# Patient Record
Sex: Male | Born: 1970 | Race: White | Hispanic: No | Marital: Single | Smoking: Never smoker
Health system: Southern US, Community
[De-identification: ages and names within clinical notes are randomized; demographics above are authoritative.]

## PROBLEM LIST (undated history)

## (undated) DIAGNOSIS — I2699 Other pulmonary embolism without acute cor pulmonale: Secondary | ICD-10-CM

## (undated) HISTORY — PX: LUMBAR SPINE SURGERY: SHX701

## (undated) HISTORY — PX: ANKLE FRACTURE SURGERY: SHX122

## (undated) HISTORY — DX: Other pulmonary embolism without acute cor pulmonale: I26.99

---

## 2006-09-24 DIAGNOSIS — I2699 Other pulmonary embolism without acute cor pulmonale: Secondary | ICD-10-CM

## 2006-09-24 HISTORY — DX: Other pulmonary embolism without acute cor pulmonale: I26.99

## 2008-01-23 ENCOUNTER — Ambulatory Visit (HOSPITAL_BASED_OUTPATIENT_CLINIC_OR_DEPARTMENT_OTHER): Admission: RE | Admit: 2008-01-23 | Discharge: 2008-01-23 | Payer: Self-pay | Admitting: Orthopedic Surgery

## 2008-02-14 ENCOUNTER — Ambulatory Visit: Payer: Self-pay | Admitting: Critical Care Medicine

## 2008-02-14 ENCOUNTER — Inpatient Hospital Stay (HOSPITAL_COMMUNITY): Admission: EM | Admit: 2008-02-14 | Discharge: 2008-02-17 | Payer: Self-pay | Admitting: Emergency Medicine

## 2008-02-17 ENCOUNTER — Encounter (INDEPENDENT_AMBULATORY_CARE_PROVIDER_SITE_OTHER): Payer: Self-pay | Admitting: Internal Medicine

## 2008-02-17 ENCOUNTER — Ambulatory Visit: Payer: Self-pay | Admitting: Vascular Surgery

## 2008-02-17 ENCOUNTER — Encounter (INDEPENDENT_AMBULATORY_CARE_PROVIDER_SITE_OTHER): Payer: Self-pay | Admitting: Emergency Medicine

## 2008-02-18 ENCOUNTER — Encounter: Payer: Self-pay | Admitting: Critical Care Medicine

## 2008-02-18 ENCOUNTER — Ambulatory Visit: Payer: Self-pay | Admitting: Critical Care Medicine

## 2008-02-18 ENCOUNTER — Ambulatory Visit: Payer: Self-pay | Admitting: Cardiology

## 2008-02-18 ENCOUNTER — Inpatient Hospital Stay (HOSPITAL_COMMUNITY): Admission: AD | Admit: 2008-02-18 | Discharge: 2008-02-21 | Payer: Self-pay | Admitting: Critical Care Medicine

## 2008-02-18 DIAGNOSIS — I2699 Other pulmonary embolism without acute cor pulmonale: Secondary | ICD-10-CM

## 2008-02-18 DIAGNOSIS — I82409 Acute embolism and thrombosis of unspecified deep veins of unspecified lower extremity: Secondary | ICD-10-CM | POA: Insufficient documentation

## 2008-02-23 ENCOUNTER — Ambulatory Visit: Payer: Self-pay | Admitting: Cardiovascular Disease

## 2008-02-27 ENCOUNTER — Ambulatory Visit: Payer: Self-pay | Admitting: Cardiovascular Disease

## 2008-03-04 ENCOUNTER — Encounter: Payer: Self-pay | Admitting: Critical Care Medicine

## 2008-03-08 ENCOUNTER — Telehealth (INDEPENDENT_AMBULATORY_CARE_PROVIDER_SITE_OTHER): Payer: Self-pay | Admitting: *Deleted

## 2008-03-08 ENCOUNTER — Encounter: Payer: Self-pay | Admitting: Critical Care Medicine

## 2008-03-12 ENCOUNTER — Ambulatory Visit: Payer: Self-pay | Admitting: Internal Medicine

## 2008-03-12 ENCOUNTER — Ambulatory Visit: Payer: Self-pay | Admitting: Critical Care Medicine

## 2008-03-24 ENCOUNTER — Ambulatory Visit: Payer: Self-pay | Admitting: Internal Medicine

## 2008-05-20 ENCOUNTER — Ambulatory Visit: Payer: Self-pay | Admitting: Cardiology
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
## 2011-02-06 NOTE — H&P (Signed)
NAME:  Nathaniel Manning, CORP NO.:  [id number]   MEDICAL RECORD NO.:  [id number]          PATIENT TYPE:  INP   LOCATION:  1226                         FACILITY:  [facility]   PHYSICIAN:  Charlcie Cradle. Delford Field, MD, FCCPDATE OF BIRTH:  09[birth year]73   DATE OF ADMISSION:  02/18/2008  DATE OF DISCHARGE:                              HISTORY & PHYSICAL   CHIEF COMPLAINT:  Fever and presyncopal episode, previous history of  pulmonary embolism.   HISTORY OF PRESENT ILLNESS:  A 40 year old male was just discharged on  Feb 17, 2008, after being admitted on Feb 14, 2008, for acute pulmonary  embolism right lower lobe and right lower extremity deep venous  thrombosis.  He had previously had open reduction internal fixation of  the fibula fracture three weeks previous, found to have deep venous  thrombosis of the gastroc vein and the popliteal vein with associated  right lower lobe pulmonary embolism.  He was given initially IV heparin  and switched to subcutaneous Lovenox twice daily with Coumadin bridging.  On discharge, his INR was 1.2, today it was 1.8 in the clinic on 15 mg  daily Coumadin.  He was maintaining the Lovenox as well twice daily.  The patient called this morning and stated he was having some more pain  in the chest and fever to 102 degrees and sweats.  We brought him to the  office, obtained a chest x-ray finding and no acute infiltrate or  process.  He had a presyncopal vagal episode while in the office getting  his INR checked with fingerstick.  On this basis, we readmitted him to  the hospital to evaluate for failure to respond to Lovenox therapy as an  outpatient and further studies.   PAST MEDICAL HISTORY:  Essentially noncontributory.   ALLERGIES:  None.   MEDICATIONS PRIOR TO ADMISSION:  Lovenox of twice daily dosing subcu and  associated Coumadin 15 mg daily.   PAST SURGICAL HISTORY:  Ankle fracture surgery only.  There is no other  significant medical history  noted.   FAMILY HISTORY:  Noncontributory.   SOCIAL HISTORY:  Nonsmoker, no alcohol except for social reasons.   PHYSICAL EXAMINATION:  VITAL SIGNS:  Temperature 98, blood pressure  126/74, pulse 104, respirations 12, saturation 100% on room air.  GENERAL:  This is a well-developed, well-nourished white male,  diaphoretic and pale.  CHEST:  Clear bilaterally without evidence of wheeze or rhonchi.  CARDIAC:  Exam showed a regular rate and rhythm without S3, normal S1  and S2.  ABDOMEN:  Soft, nontender.  EXTREMITIES:  Showed some cyanosis right lower extremity and edema  around the ankle but no new changes compared to previous exam two days  previous.   LABORATORY DATA:  Pending at time of this dictation.   CT scan of the chest is pending.   IMPRESSION:  Rule out recurrent or progression of pulmonary emboli  despite Lovenox therapy.  The patient is not yet anticoagulated on the  basis of INR with Coumadin therapy.   RECOMMENDATIONS:  Recheck CT scan of the chest and switch Lovenox  over  to IV heparin and utilize heparin for the full course and bridge with  Coumadin to continue until INR is therapeutic two days in row.  Check  blood cultures and urinalysis and urine cultures for hospital-acquired  infection.  Hold antibiotics as of yet.  Check lab data.  Check echo.      Charlcie Cradle Delford Field, MD, [facility]  Electronically Signed     PEW/MEDQ  D:  02/18/2008  T:  02/18/2008  Job:  [id number]   cc:   Corinna L. Lendell Caprice, MD   Harvie Junior, M.D.  Fax: [phone]

## 2011-02-06 NOTE — Discharge Summary (Signed)
NAME:  Nathaniel Manning, NETTLE NO.:  [id number]   MEDICAL RECORD NO.:  [id number]          PATIENT TYPE:  INP   LOCATION:  1507                         FACILITY:  [facility]   PHYSICIAN:  Charlcie Cradle. Delford Field, MD, FCCPDATE OF BIRTH:  Apr 16, 1971   DATE OF ADMISSION:  02/18/2008  DATE OF DISCHARGE:  02/21/2008                               DISCHARGE SUMMARY   DISCHARGE DIAGNOSIS:  Right pulmonary pain with history of pulmonary  emboli and resistant to anticoagulation on an outpatient basis.   HISTORY OF PRESENT ILLNESS:  Mr. Nathaniel Manning is a 40 year old male  discharged from home on Feb 17, 2008 after being admitted on Feb 14, 2008 for acute pulmonary embolism on the right lower lobe and right  lower extremity, deep venous thrombosis.  He had previously had open  reduction internal fixation and fibula fracture three weeks previously.  Was found to have deep venous thrombosis of the gastroc vein and  popliteal vein associated with right lower lobe pulmonary emboli.  He  was given initially IV heparin, switched to subcutaneous Lovenox b.i.d.,  and was maintaining on Lovenox twice daily.  He was also started on 15  mg of Coumadin with INR 1.8.  The patient called the office.  Reported  he was having some pain in the chest, fever 102 degrees, and sweats.  He  was brought into the office.  Known to have some syncope.  Therefore he  was admitted to the hospital for further evaluation and treatment.   LABORATORY DATA:  WBC 10.8, hemoglobin 12.4, hematocrit 34, platelets  267.  PT 19.3, INR 1.6.  Subsequent INR level 1.9 and 2.3.  Sodium 141,  potassium 4, chloride 106, CO2 28, glucose 95, BUN 9, creatinine 1.02.  Total protein 6.7.  Albumin 3.4.  AST 33, ALT 48. Albumin 128.  Total  bilirubin 0.8.  Blood cultures showed no growth.   2D echocardiogram revealed EF 60%, normal aortic valve, normal mitral  valve.  CT angio of the chest showed decrease in size of larger more  central right  lower lobe pulmonary emboli since prior study, small  peripheral posterior pulmonary embolus which is unchanged, no new  pulmonary emboli, slight increase in size in right pleural effusion,  continued right lower lobe atelectasis, lower infarction posteriorly.  Chest x-ray showed increased atelectasis, lower infarction of the right  medial lobe.   HOSPITAL COURSE BY DISCHARGE DIAGNOSES:  1. History of right lower lobe pulmonary emboli secondary to right      lower extremity deep venous thrombosis.  He was admitted for      continuation of severe pleuritic pain.  CT of the chest did not      reveal any increase in pulmonary emboli or new pulmonary emboli.      His pain was addressed with Tramadol and ibuprofen.  He was placed      on IV heparin and then switched back to Lovenox and continued on      Coumadin 15 mg.  INR was therapeutic, but at discharge at 2.3.  He  had followup with Coumadin clinic with Dr. Shelby Dubin for further      evaluation and control of his Coumadin.  2. History of right lower extremity ankle deep venous thrombosis      status post ORIF.  No change.  3. Right pleurisy which was treated as noted.   DISCHARGE MEDICATIONS:  1. Coumadin 15 mg May 30 and May 31 taken at 6 p.m.  2. Lovenox 95 mg take tonight at 9 p.m.  3. Advil 400-600 mg p.o. as needed.  4. Vicodin as previously prescribed p.r.n.   He has a followup appointment with Dr. Shelby Dubin within two days, Dr.  Shan Levans in one week.   DISPOSITION:  Status condition on discharge is improved.   DIET:  Regular diet.      Devra Dopp, MSN, ACNP      Charlcie Cradle. Delford Field, MD, [facility]  Electronically Signed    SM/MEDQ  D:  03/16/2008  T:  03/16/2008  Job:  [id number]   cc:   Harvie Junior, M.D.  Fax: [phone]   Charlcie Cradle Delford Field, MD, FCCP  [address]  [place]  [place] [address]   Shelby Dubin, PharmD, BCPS, CPP  [address] [place], [place] [address]
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
## 2011-02-06 NOTE — Consult Note (Signed)
NAME:  Nathaniel Manning, Nathaniel Manning NO.:  [id number]   MEDICAL RECORD NO.:  [id number]          PATIENT TYPE:  INP   LOCATION:  4708                         FACILITY:  [facility]   PHYSICIAN:  Charlcie Cradle. Delford Field, MD, FCCPDATE OF BIRTH:  [birth year]   DATE OF CONSULTATION:  02/14/2008  DATE OF DISCHARGE:                                 CONSULTATION   HISTORY OF PRESENT ILLNESS:  This is a 40 year old male with previous  ankle surgery 2-1/2 weeks ago, presenting with right-sided pleuritic  chest pain and found to have right lower lobe pulmonary embolism on CT  scan.  He was admitted for further inpatient care, now on IV heparin.  CT abdomen was negative.   PAST MEDICAL HISTORY:  Noncontributory.   MEDICATIONS:  Prior to admission, Percocet.   ALLERGIES:  None.   SOCIAL HISTORY:  Denies tobacco, drinks beer occasionally.   FAMILY HISTORY:  Lymphatic cancer in the grandfather.   REVIEW OF SYSTEMS:  Noncontributory.   PHYSICAL EXAMINATION:  VITAL SIGNS:  T-max 101.8, blood pressure  satisfactory, saturation is 95%, and respirations 20.  HEENT:  Unremarkable.  CHEST:  Showed to be clear.  Decreased breath sounds on the right.  CARDIAC:  Showed a regular rate and rhythm.  Normal S1 and S2.  ABDOMEN:  Soft and nontender.  EXTREMITIES:  Showed no edema or clubbing, except there is a tenderness  in the right lower extremity, boot in place.   LABORATORY DATA:  CT scan of the chest shows right lower lobe pulmonary  embolism.  White count 13,000, hemoglobin 13.1, platelet count 248.  INR  1.0.   IMPRESSION:  Pulmonary embolism after ankle fracture surgery and repair.   RECOMMENDATIONS:  Change to Lovenox and consider ___________.  The  patient is leaning towards the ___________ with hypercoagulable workup.  We will need 3 months of therapy with Coumadin, if he decides to go with  standard therapy.  We will see this patient in followup as an  outpatient.      Charlcie Cradle Delford Field,  MD, [facility]  Electronically Signed     PEW/MEDQ  D:  02/14/2008  T:  02/14/2008  Job:  [id number]   cc:   Harvie Junior, M.D.  Oley Balm Georgina Pillion, M.D.  Kela Millin, M.D.

## 2011-06-20 LAB — ALT: ALT: 29

## 2011-06-20 LAB — URINALYSIS, ROUTINE W REFLEX MICROSCOPIC
Bilirubin Urine: NEGATIVE
Glucose, UA: NEGATIVE
Hgb urine dipstick: NEGATIVE
Hgb urine dipstick: NEGATIVE
Nitrite: NEGATIVE
Specific Gravity, Urine: 1.016
Urobilinogen, UA: 0.2
pH: 6
pH: 8

## 2011-06-20 LAB — BASIC METABOLIC PANEL
BUN: 13
BUN: 7
BUN: 9
CO2: 25
CO2: 27
Calcium: 8.8
Calcium: 9.2
Calcium: 9.3
Chloride: 101
Chloride: 103
Creatinine, Ser: 1.02
Creatinine, Ser: 1.11
Creatinine, Ser: 1.12
GFR calc Af Amer: >60
GFR calc Af Amer: >60
GFR calc non Af Amer: >60
GFR calc non Af Amer: >60
Glucose, Bld: 95
Potassium: 3.7
Potassium: 3.9
Potassium: 4
Sodium: 141

## 2011-06-20 LAB — CBC
HCT: 32.6 — ABNORMAL LOW
HCT: 35 — ABNORMAL LOW
HCT: 36 — ABNORMAL LOW
HCT: 39.6
Hemoglobin: 12.2 — ABNORMAL LOW
MCHC: 34.1
MCHC: 34.7
MCHC: 35.2
MCHC: 35.3
MCV: 83.4
MCV: 84.7
Platelets: 203
Platelets: 221
Platelets: 246
Platelets: 248
Platelets: 267
Platelets: 298
Platelets: 305
RBC: 4.07 — ABNORMAL LOW
RBC: 4.1 — ABNORMAL LOW
RBC: 4.22
RBC: 4.67
RDW: 12.3
RDW: 12.6
RDW: 12.8
RDW: 12.8
RDW: 13.2
WBC: 10.8 — ABNORMAL HIGH
WBC: 10.8 — ABNORMAL HIGH
WBC: 6.6
WBC: 7.2
WBC: 7.8
WBC: 9.9

## 2011-06-20 LAB — URINE CULTURE
Colony Count: 2000
Colony Count: NO GROWTH
Special Requests: NEGATIVE

## 2011-06-20 LAB — CARDIOLIPIN ANTIBODIES, IGG, IGM, IGA: Anticardiolipin IgM: <7 — ABNORMAL LOW (ref <10)

## 2011-06-20 LAB — PROTIME-INR
INR: 1
INR: 1
INR: 1.1
INR: 1.2
INR: 1.6 — ABNORMAL HIGH
INR: 1.9 — ABNORMAL HIGH
INR: 2.3 — ABNORMAL HIGH
Prothrombin Time: 13.4
Prothrombin Time: 13.7
Prothrombin Time: 14.9
Prothrombin Time: 21.9 — ABNORMAL HIGH
Prothrombin Time: 26.1 — ABNORMAL HIGH

## 2011-06-20 LAB — COMPREHENSIVE METABOLIC PANEL
AST: 33
Albumin: 3.4 — ABNORMAL LOW
BUN: 12
Calcium: 9.4
Creatinine, Ser: 1.13
GFR calc Af Amer: >60
Total Bilirubin: 0.8
Total Protein: 6.7

## 2011-06-20 LAB — HEPARIN LEVEL (UNFRACTIONATED)
Heparin Unfractionated: 0.16 — ABNORMAL LOW
Heparin Unfractionated: 0.41
Heparin Unfractionated: 0.63

## 2011-06-20 LAB — CULTURE, BLOOD (ROUTINE X 2): Culture: NO GROWTH

## 2011-06-20 LAB — PROTEIN S, TOTAL: Protein S Ag, Total: 139 % (ref 70–140)

## 2011-06-20 LAB — DIFFERENTIAL
Basophils Absolute: 0.1
Basophils Relative: 0
Basophils Relative: 1
Eosinophils Absolute: 0.1
Lymphs Abs: 2.6
Monocytes Relative: 10
Monocytes Relative: 8
Neutro Abs: 10.5 — ABNORMAL HIGH
Neutro Abs: 6.6
Neutrophils Relative %: 61
Neutrophils Relative %: 80 — ABNORMAL HIGH

## 2011-06-20 LAB — PROTEIN C, TOTAL: Protein C, Total: 99 % (ref 70–140)

## 2011-06-20 LAB — PROTEIN C ACTIVITY: Protein C Activity: 133 % (ref 75–133)

## 2011-06-20 LAB — HOMOCYSTEINE: Homocysteine: 7.6

## 2011-06-20 LAB — LUPUS ANTICOAGULANT PANEL: DRVVT: 53.6 — ABNORMAL HIGH (ref 36.1–47.0)

## 2011-06-20 LAB — BETA-2-GLYCOPROTEIN I ABS, IGG/M/A
Beta-2 Glyco I IgG: <4 U/mL (ref <20)
Beta-2-Glycoprotein I IgA: 5 U/mL (ref <10)
Beta-2-Glycoprotein I IgM: <4 U/mL (ref <10)

## 2011-06-20 LAB — PROTEIN S ACTIVITY: Protein S Activity: 130 % — ABNORMAL HIGH (ref 69–129)

## 2011-06-20 LAB — PROTHROMBIN GENE MUTATION

## 2015-04-27 ENCOUNTER — Ambulatory Visit: Payer: Self-pay | Admitting: Family Medicine

## 2015-05-05 ENCOUNTER — Ambulatory Visit: Payer: Self-pay | Admitting: Family Medicine

## 2015-05-05 ENCOUNTER — Telehealth: Payer: Self-pay | Admitting: Family Medicine

## 2015-05-05 NOTE — Telephone Encounter (Signed)
Pt has been sch

## 2015-05-05 NOTE — Telephone Encounter (Signed)
See below

## 2015-05-05 NOTE — Telephone Encounter (Signed)
You may but try to make sure I have at least 3-4 SDA slots left on the day this i scheduled

## 2015-05-05 NOTE — Telephone Encounter (Signed)
We reschedule pt to 05-05-15 and pt unable to make the appointment today. Pt is out of town. Can I create 30 min appt?

## 2015-05-09 ENCOUNTER — Ambulatory Visit (INDEPENDENT_AMBULATORY_CARE_PROVIDER_SITE_OTHER): Payer: 59 | Admitting: Family Medicine

## 2015-05-09 ENCOUNTER — Encounter: Payer: Self-pay | Admitting: Family Medicine

## 2015-05-09 VITALS — BP 124/74 | HR 75 | Temp 98.8°F | Ht 71.0 in | Wt 208.0 lb

## 2015-05-09 DIAGNOSIS — I2699 Other pulmonary embolism without acute cor pulmonale: Secondary | ICD-10-CM | POA: Insufficient documentation

## 2015-05-09 DIAGNOSIS — Z23 Encounter for immunization: Secondary | ICD-10-CM | POA: Diagnosis not present

## 2015-05-09 DIAGNOSIS — Z86711 Personal history of pulmonary embolism: Secondary | ICD-10-CM | POA: Insufficient documentation

## 2015-05-09 DIAGNOSIS — Z7251 High risk heterosexual behavior: Secondary | ICD-10-CM

## 2015-05-09 DIAGNOSIS — Z0000 Encounter for general adult medical examination without abnormal findings: Secondary | ICD-10-CM

## 2015-05-09 MED ORDER — SILDENAFIL CITRATE 20 MG PO TABS: 40.0000 mg | ORAL_TABLET | Freq: Every day | ORAL | Status: DC | PRN

## 2015-05-09 NOTE — Patient Instructions (Signed)
Medication Instructions:  None needed, multivitamin fine  Labwork: Schedule a lab visit at the front desk. Return for future fasting labs. Nothing but water after midnight please.   Need 2 different urines- dirty urine (1st small amount in 1 cup) then clean urine (mid stream) when you come in. Do not pee an hour before coming  Testing/Procedures/Immunizations: Tetanus shot before you leave  Advise flu shot in October or so  Follow-Up (all visit scheduling, rescheduling, cancellations including labs should be scheduled at front desk): 1 year follow up or as needed

## 2015-05-09 NOTE — Progress Notes (Signed)
Nathaniel Conch, MD Phone: [phone]  Subjective:  Patient presents today to establish care and for annual physical. Chief complaint-noted.   After broken ankle/DVT that went to lung.    Exercise 3x a week- walks dog, gym every so often  Blue apron delivered weekly. Enjoys cooking. Mainly water. Mega men vitamin through [facility]  8 hours of sleep.   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   The following were reviewed and entered/updated in epic: Past Medical History  Diagnosis Date  . Pulmonary embolism 2008    After broken ankle/DVT that went to lung. Coumadin/lovenox 3-4 months.    Patient Active Problem List   Diagnosis Date Noted  . Pulmonary embolism     Priority: High  . Erectile dysfunction 05/10/2015    Priority: Low   Past Surgical History  Procedure Laterality Date  . Ankle fracture surgery      Dr. Luiz Blare    Family History  Problem Relation Age of Onset  . Other Mother     sinus cavity cancer- presented with eye issues. 6 months chemo/radiation- lost eye  . Alcohol abuse Father     estranged  . Breast cancer      gma- 89,lived to 101    Medications- reviewed and updated. None prior to visit  Allergies-reviewed and updated No Known Allergies  Social History   Social History  . Marital Status: Single    Spouse Name: N/A  . Number of Children: N/A  . Years of Education: N/A   Social History Main Topics  . Smoking status: Never Smoker   . Smokeless tobacco: Not on file  . Alcohol Use: 3.6 oz/week    6 Standard drinks or equivalent per week  . Drug Use: No  . Sexual Activity: Not on file   Other Topics Concern  . Not on file   Social History Narrative   Single-Dating, lives with girlfriend. No kids. Considering pregnancy with girlfriend- if it happens it happens.       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 [employer]: active enjoys outside, used to race motorcycles, travel, enjoys cooking/foodie.        ROS--See HPI , otherwise  full ROS was completed and negative except as noted above  Objective: BP 124/74 mmHg  Pulse 75  Temp(Src) 98.8 F (37.1 C)  Ht  (1.803 m)  Wt 208 lb (94.348 kg)  BMI 29.02 kg/m2 Gen: NAD, resting comfortably HEENT: Mucous membranes are moist. Oropharynx normal. TM normal. Eyes: sclera and lids normal, PERRLA Neck: no thyromegaly, no cervical lymphadenopathy CV: RRR no murmurs rubs or gallops Lungs: CTAB no crackles, wheeze, rhonchi Abdomen: soft/nontender/nondistended/normal bowel sounds. No rebound or guarding.  GU declined Ext: no edema Skin: warm, dry Neuro: 5/5 strength in upper and lower extremities, normal gait, normal reflexes   Assessment/Plan:  44 y.o. male presenting for annual physical.  Health Maintenance counseling: 1. Anticipatory guidance: Patient counseled regarding regular dental exams, wearing seatbelts, sunscreen. Does not see derm.  2. Risk factor reduction:  Advised patient of need for regular exercise and diet rich and fruits and vegetables to reduce risk of heart attack and stroke. Doing well- see HPI 3. Immunizations/screenings/ancillary studies Health Maintenance Due  Topic Date Due  . HIV Screening - labs 09/13/1988  . TETANUS/TDAP - today 09/13/1992  . INFLUENZA VACCINE - oct 04/25/2015  4. Colon cancer screening- starting age 12 5. Prostate cancer screening- age 67 earliest 49. Advised monthly testicular self exams 7. Opts  in STD testing  Erectile dysfunction S: trouble achieving and maintaining erection. Taken viagra in the past for ED. Viagra 100. effective A/P: restart viagra- prefers sildenafil- aware this is off label use   Future fasting labs  Orders Placed This Encounter  Procedures  . Tdap vaccine greater than or equal to 7yo IM  . CBC with Differential/Platelet    Standing Status: Future     Number of Occurrences:      Standing Expiration Date: 05/08/2016  . Comprehensive metabolic panel    [place]    Standing Status:  Future     Number of Occurrences:      Standing Expiration Date: 05/08/2016    Order Specific Question:  Has the patient fasted?    Answer:  No  . Lipid panel    [place]    Standing Status: Future     Number of Occurrences:      Standing Expiration Date: 05/08/2016    Order Specific Question:  Has the patient fasted?    Answer:  No  . TSH    [place]    Standing Status: Future     Number of Occurrences:      Standing Expiration Date: 05/08/2016  . RPR    [facility]    Standing Status: Future     Number of Occurrences:      Standing Expiration Date: 05/08/2016  . HIV antibody    [facility]    Standing Status: Future     Number of Occurrences:      Standing Expiration Date: 05/08/2016  . POCT urinalysis dipstick    In house    Standing Status: Future     Number of Occurrences:      Standing Expiration Date: 05/08/2016    Meds ordered this encounter  Medications  . sildenafil (REVATIO) 20 MG tablet    Sig: Take 2-5 tablets (40-100 mg total) by mouth daily as needed (for erectile dysfunction).    Dispense:  50 tablet    Refill:  0

## 2015-05-10 ENCOUNTER — Encounter: Payer: Self-pay | Admitting: Family Medicine

## 2015-05-10 DIAGNOSIS — N529 Male erectile dysfunction, unspecified: Secondary | ICD-10-CM | POA: Insufficient documentation

## 2015-05-10 NOTE — Assessment & Plan Note (Signed)
S: trouble achieving and maintaining erection. Taken viagra in the past for ED. Viagra 100. effective A/P: restart viagra- prefers sildenafil- aware this is off label use

## 2015-05-18 ENCOUNTER — Other Ambulatory Visit (INDEPENDENT_AMBULATORY_CARE_PROVIDER_SITE_OTHER): Payer: 59

## 2015-05-18 ENCOUNTER — Other Ambulatory Visit (HOSPITAL_COMMUNITY)
Admission: RE | Admit: 2015-05-18 | Discharge: 2015-05-18 | Disposition: A | Discharge status: Home / Self Care | Payer: 59 | Source: Ambulatory Visit | Attending: Family Medicine | Admitting: Family Medicine

## 2015-05-18 DIAGNOSIS — Z0000 Encounter for general adult medical examination without abnormal findings: Secondary | ICD-10-CM

## 2015-05-18 DIAGNOSIS — Z7251 High risk heterosexual behavior: Secondary | ICD-10-CM

## 2015-05-18 DIAGNOSIS — Z113 Encounter for screening for infections with a predominantly sexual mode of transmission: Secondary | ICD-10-CM | POA: Diagnosis not present

## 2015-05-18 LAB — POCT URINALYSIS DIPSTICK
BILIRUBIN UA: NEGATIVE
Blood, UA: NEGATIVE
Glucose, UA: NEGATIVE
KETONES UA: NEGATIVE
Leukocytes, UA: NEGATIVE
Nitrite, UA: NEGATIVE
PH UA: 7
PROTEIN UA: NEGATIVE
SPEC GRAV UA: 1.015
Urobilinogen, UA: 0.2

## 2015-05-18 LAB — CBC WITH DIFFERENTIAL/PLATELET
BASOS PCT: 0.7 % (ref 0.0–3.0)
Basophils Absolute: 0 10*3/uL (ref 0.0–0.1)
EOS ABS: 0.2 10*3/uL (ref 0.0–0.7)
Eosinophils Relative: 3.5 % (ref 0.0–5.0)
HCT: 45.4 % (ref 39.0–52.0)
HEMOGLOBIN: 15.4 g/dL (ref 13.0–17.0)
LYMPHS ABS: 2.2 10*3/uL (ref 0.7–4.0)
Lymphocytes Relative: 31.8 % (ref 12.0–46.0)
MCHC: 34 g/dL (ref 30.0–36.0)
MCV: 86.8 fl (ref 78.0–100.0)
MONO ABS: 0.6 10*3/uL (ref 0.1–1.0)
Monocytes Relative: 8.4 % (ref 3.0–12.0)
NEUTROS ABS: 3.8 10*3/uL (ref 1.4–7.7)
NEUTROS PCT: 55.6 % (ref 43.0–77.0)
PLATELETS: 243 10*3/uL (ref 150.0–400.0)
RBC: 5.23 Mil/uL (ref 4.22–5.81)
RDW: 13.3 % (ref 11.5–15.5)
WBC: 6.8 10*3/uL (ref 4.0–10.5)

## 2015-05-18 LAB — LIPID PANEL
CHOL/HDL RATIO: 5
Cholesterol: 275 mg/dL — ABNORMAL HIGH (ref 0–200)
HDL: 60 mg/dL (ref >39.00)
LDL CALC: 191 mg/dL — AB (ref 0–99)
NonHDL: 214.87
Triglycerides: 120 mg/dL (ref 0.0–149.0)
VLDL: 24 mg/dL (ref 0.0–40.0)

## 2015-05-18 LAB — COMPREHENSIVE METABOLIC PANEL
ALT: 36 U/L (ref 0–53)
AST: 28 U/L (ref 0–37)
Albumin: 4.5 g/dL (ref 3.5–5.2)
Alkaline Phosphatase: 70 U/L (ref 39–117)
BUN: 12 mg/dL (ref 6–23)
CHLORIDE: 99 meq/L (ref 96–112)
CO2: 31 meq/L (ref 19–32)
CREATININE: 1.14 mg/dL (ref 0.40–1.50)
Calcium: 10 mg/dL (ref 8.4–10.5)
GFR: 74.99 mL/min (ref >60.00)
GLUCOSE: 100 mg/dL — AB (ref 70–99)
POTASSIUM: 4.5 meq/L (ref 3.5–5.1)
SODIUM: 138 meq/L (ref 135–145)
Total Bilirubin: 0.5 mg/dL (ref 0.2–1.2)
Total Protein: 7.5 g/dL (ref 6.0–8.3)

## 2015-05-18 LAB — TSH: TSH: 3.15 u[IU]/mL (ref 0.35–4.50)

## 2015-05-19 LAB — URINE CYTOLOGY ANCILLARY ONLY
CHLAMYDIA, DNA PROBE: NEGATIVE
Neisseria Gonorrhea: NEGATIVE
Trichomonas: NEGATIVE

## 2015-05-19 LAB — HIV ANTIBODY (ROUTINE TESTING W REFLEX): HIV 1&2 Ab, 4th Generation: NONREACTIVE

## 2015-05-19 LAB — RPR

## 2015-08-17 ENCOUNTER — Telehealth: Payer: Self-pay

## 2015-08-17 ENCOUNTER — Other Ambulatory Visit: Payer: Self-pay | Admitting: Family Medicine

## 2015-08-17 MED ORDER — SILDENAFIL CITRATE 20 MG PO TABS: 40.0000 mg | ORAL_TABLET | Freq: Every day | ORAL | Status: DC | PRN

## 2015-08-17 NOTE — Telephone Encounter (Signed)
Rx was call in  

## 2015-08-17 NOTE — Telephone Encounter (Signed)
Pt requesting SILDENAFIL 20MG  Pt last visit: 05/09/15 Last Rx refill 05/09/15    PLEASE ADVISE

## 2015-08-17 NOTE — Telephone Encounter (Signed)
Yes thanks may fill 

## 2015-12-14 ENCOUNTER — Encounter: Payer: Self-pay | Admitting: Family Medicine

## 2015-12-14 ENCOUNTER — Ambulatory Visit (INDEPENDENT_AMBULATORY_CARE_PROVIDER_SITE_OTHER): Payer: BLUE CROSS/BLUE SHIELD | Admitting: Family Medicine

## 2015-12-14 VITALS — BP 130/80 | HR 70 | Temp 98.0°F | Wt 203.0 lb

## 2015-12-14 DIAGNOSIS — M25512 Pain in left shoulder: Secondary | ICD-10-CM

## 2015-12-14 DIAGNOSIS — M5412 Radiculopathy, cervical region: Secondary | ICD-10-CM

## 2015-12-14 DIAGNOSIS — M79602 Pain in left arm: Secondary | ICD-10-CM

## 2015-12-14 DIAGNOSIS — M542 Cervicalgia: Secondary | ICD-10-CM | POA: Diagnosis not present

## 2015-12-14 MED ORDER — CYCLOBENZAPRINE HCL 10 MG PO TABS: 10.0000 mg | ORAL_TABLET | Freq: Three times a day (TID) | ORAL | Status: DC | PRN

## 2015-12-14 MED ORDER — PREDNISONE 20 MG PO TABS: ORAL_TABLET | ORAL | Status: DC

## 2015-12-14 NOTE — Patient Instructions (Signed)
Take prednisone for 7 days.   If not 50% improved by Monday or Tuesday- call me and will refer to orthopedics  Can use flexeril as well- consider using primarily if at home

## 2015-12-14 NOTE — Progress Notes (Signed)
Tana ConchStephen Hunter, MD  Subjective:  Nathaniel Manning is a 45 y.o. year old very pleasant male patient who presents for/with See problem oriented charting ROS- see ROS below  Past Medical History-  Patient Active Problem List   Diagnosis Date Noted  . Pulmonary embolism (HCC)     Priority: High  . Erectile dysfunction 05/10/2015    Priority: Low    Medications- reviewed and updated Current Outpatient Prescriptions  Medication Sig Dispense Refill  . sildenafil (REVATIO) 20 MG tablet Take 2-5 tablets (40-100 mg total) by mouth daily as needed (for erectile dysfunction). 50 tablet 0   No current facility-administered medications for this visit.    Objective: BP 130/80 mmHg  Pulse 70  Temp(Src) 98 F (36.7 C)  Wt 203 lb (92.08 kg)  SpO2 98% Gen: NAD, resting comfortably CV: RRR no murmurs rubs or gallops Lungs: CTAB no crackles, wheeze, rhonchi Ext: no edema or unilateral swelling Skin: warm, dry, no rash  Patient very tender to palpation in left neck out to left shoulder- muscles are very tight and with spasm  Spurling positive to left  Normal shoulder exam  5/5 strength in bilateral upper extremities- intact distal touch  Assessment/Plan:  left neck, shoulder, arm pain  S: left neck, shoulder, arm pain for 1 week. Started randomly. Denies injury or trauma. He has tried all the following: Tens unit, Tylenol, Aleve, Ultrasound unit that gf has, bengay, Ice, Hot tub with minimal relief. Rates pain as moderate and can be severe with turning neck or bending back- tends to be better with neck straight. Not improving so decided to come in. Does not go down into hand ROS_ no hand weakness, no fever or chills, no rash, no chest pain or shortness of breath. Does have history of PE A/P: I suspect cervical radiculopathy as cause. Treat with prednisone for 7 days and flexeril prn. If not 50% improved by Monday or Tuesday- will refer to orthopedics. Considered toradol shot but high  deductible plan so trying to find self pay price for worst case scenario. May come by for nurse visit on Friday as we discussed likely 24-48 hours to notice benefit from steroid. Patient does have history of PE but has no other alarming features for potential PE as cause- normal sats, HR normal, no signs DVT and prior episode was provoked  Return precautions advised.   Meds ordered this encounter  Medications  . predniSONE (DELTASONE) 20 MG tablet    Sig: Take 2 pills for 3 days, 1 pill for 4 days    Dispense:  10 tablet    Refill:  0  . cyclobenzaprine (FLEXERIL) 10 MG tablet    Sig: Take 1 tablet (10 mg total) by mouth 3 (three) times daily as needed for muscle spasms.    Dispense:  40 tablet    Refill:  0

## 2015-12-16 ENCOUNTER — Telehealth: Payer: Self-pay | Admitting: Family Medicine

## 2015-12-16 MED ORDER — TRAMADOL HCL 50 MG PO TABS: 50.0000 mg | ORAL_TABLET | Freq: Four times a day (QID) | ORAL | Status: DC | PRN

## 2015-12-16 NOTE — Telephone Encounter (Signed)
Spoke with patient and he is unable to come to the office today.  Per Dr Durene CalHunter Tramadol was called in.  If the patient has no improvement by Monday he will go to ortho.

## 2015-12-16 NOTE — Telephone Encounter (Signed)
He may come in for a shot of toradol 60mg  for trial though may only give short term relief. Could also do depo medrol and stop the prednisone. I would refer him to orthopedic surgery at this point and see if he can be seen Monday or Tuesday next week

## 2015-12-16 NOTE — Telephone Encounter (Signed)
Patient Name: Nathaniel DykeAYLOR Kief  DOB: Feb 21, 1971    Initial Comment Caller states he was seen this week for pinched nerve in neck, rx meds prednisone and flexeril, told to call back if not helping.   Nurse Assessment  Nurse: Stefano GaulStringer, RN, Dwana CurdVera Date/Time (Eastern Time): 12/16/2015 10:07:33 AM  Confirm and document reason for call. If symptomatic, describe symptoms. You must click the next button to save text entered. ---Caller states he was in earlier this week for neck pain and muscle spasms in his neck. Was prescribed prednisone and flexeril Pain is not better. Spasms are better but still having a lot of pain. No fever. Pain level was 8 and he is still having constant pain with pain level of 5. Ibuprofen is not helping with the pain.  Has the patient traveled out of the country within the last 30 days? ---Not Applicable  Does the patient have any new or worsening symptoms? ---Yes  Will a triage be completed? ---Yes  Related visit to physician within the last 2 weeks? ---Yes  Does the PT have any chronic conditions? (i.e. diabetes, asthma, etc.) ---No  Is this a behavioral health or substance abuse call? ---No     Guidelines    Guideline Title Affirmed Question Affirmed Notes  Neck Pain or Stiffness [1] MODERATE neck pain (e.g., interferes with normal activities AND [2] present > 3 days    Final Disposition User   See PCP When Office is Open (within 3 days) Stefano GaulStringer, Charity fundraiserN, Dwana CurdVera    Comments  Pt states he does not want to make appt as he was already there this week and Dr. Durene CalHunter has run several tests. He is wanting to know if he can get something stronger for pain or if dosage can be increased on his prescribed medications. Please call pt back and let him know about medications.   Referrals  GO TO FACILITY REFUSED   Disagree/Comply: Disagree  Disagree/Comply Reason: Disagree with instructions

## 2015-12-16 NOTE — Telephone Encounter (Signed)
Left message on machine for patient to return our call 

## 2015-12-22 ENCOUNTER — Other Ambulatory Visit: Payer: Self-pay | Admitting: *Deleted

## 2015-12-22 ENCOUNTER — Ambulatory Visit
Admission: RE | Admit: 2015-12-22 | Discharge: 2015-12-22 | Disposition: A | Discharge status: Home / Self Care | Payer: BLUE CROSS/BLUE SHIELD | Source: Ambulatory Visit | Attending: *Deleted | Admitting: *Deleted

## 2015-12-22 DIAGNOSIS — M542 Cervicalgia: Secondary | ICD-10-CM

## 2016-02-08 ENCOUNTER — Other Ambulatory Visit: Payer: Self-pay | Admitting: Chiropractic Medicine

## 2016-02-08 DIAGNOSIS — M79602 Pain in left arm: Secondary | ICD-10-CM

## 2016-02-08 DIAGNOSIS — M542 Cervicalgia: Secondary | ICD-10-CM

## 2016-02-16 ENCOUNTER — Telehealth: Payer: Self-pay | Admitting: Family Medicine

## 2016-02-16 ENCOUNTER — Ambulatory Visit
Admission: RE | Admit: 2016-02-16 | Discharge: 2016-02-16 | Disposition: A | Discharge status: Home / Self Care | Payer: BLUE CROSS/BLUE SHIELD | Source: Ambulatory Visit | Attending: Chiropractic Medicine | Admitting: Chiropractic Medicine

## 2016-02-16 DIAGNOSIS — M79602 Pain in left arm: Secondary | ICD-10-CM

## 2016-02-16 DIAGNOSIS — M542 Cervicalgia: Secondary | ICD-10-CM

## 2016-02-16 NOTE — Telephone Encounter (Signed)
May give a hydrocodone 5-325 #1 for before procedure. I had advised orthopedics- appears he went to chiropractor from ordering provider. Did he also see ortho? I would consider gabapentin but would want to see him back to discuss titration

## 2016-02-16 NOTE — Telephone Encounter (Signed)
Pt states he had his MRI scheduled this am, but he could not lay down long enough due to the pain in his neck.  They have rescheduled  for Sunday.  Pt states they advised pt will need something for the pain. Pain in his neck is worse when he lays down, and no other way they can do the MRI.  Pt also states he was advised he could perhaps be prescribed gabapentin for the nerve pain as well. Pt would like to know if you will prescribe him something.  [facility]/ [address]

## 2016-02-16 NOTE — Telephone Encounter (Signed)
Spoke to pt, told him Dr. Durene CalHunter said he will give him Hydrocodone 5-325 mg one tablet to take prior to procedure. He said he would consider gabapentin wants to see you to discuss. Asked pt if he can come in tomorrow? Pt said yes. Told pt times available and pt said can come in at 3:45 PM to see Dr.Hunter. Told pt okay appt scheduled and will give you Rx when you come in. Pt verbalized understanding.

## 2016-02-17 ENCOUNTER — Encounter: Payer: Self-pay | Admitting: Family Medicine

## 2016-02-17 ENCOUNTER — Ambulatory Visit (INDEPENDENT_AMBULATORY_CARE_PROVIDER_SITE_OTHER): Payer: BLUE CROSS/BLUE SHIELD | Admitting: Family Medicine

## 2016-02-17 VITALS — BP 140/80 | HR 92 | Temp 98.3°F | Wt 199.0 lb

## 2016-02-17 DIAGNOSIS — M5412 Radiculopathy, cervical region: Secondary | ICD-10-CM | POA: Diagnosis not present

## 2016-02-17 MED ORDER — GABAPENTIN 100 MG PO CAPS: 100.0000 mg | ORAL_CAPSULE | Freq: Every day | ORAL | Status: DC

## 2016-02-17 MED ORDER — OXYCODONE-ACETAMINOPHEN 10-325 MG PO TABS: ORAL_TABLET | ORAL | Status: DC

## 2016-02-17 NOTE — Patient Instructions (Addendum)
Take 100mg  (1 pill) before bed for 3 days Then take 200mg  (2 pills) before bed for 3 days Then take 300 mg (3 pills) before bed for 3 days  1x dose of percocet before MRI  We will call you within a week about your referral to neurosurgery.  Notes I placed in referral "Requests Dr. Bettina GaviaNoodleman specifically.  Severe pain- getting MRI this Sunday. Please get in as soon as possible"

## 2016-02-17 NOTE — Progress Notes (Signed)
Subjective:  Nathaniel Manning is a [age] y.o. year old very pleasant male patient who presents for/with See problem oriented charting ROS- no arm weakness, no dropping objects. No numbness/tiingling into the arm.see any ROS included in HPI as well.   Past Medical History-  Patient Active Problem List   Diagnosis Date Noted  . Pulmonary embolism (HCC)     Priority: High  . Erectile dysfunction 05/10/2015    Priority: Low    Medications- reviewed and updated Current Outpatient Prescriptions  Medication Sig Dispense Refill  . traMADol (ULTRAM) 50 MG tablet Take 1 tablet (50 mg total) by mouth every 6 (six) hours as needed. (Patient not taking: Reported on 02/17/2016) 30 tablet 0   No current facility-administered medications for this visit.    Objective: BP 140/80 mmHg  Pulse 92  Temp(Src) 98.3 F (36.8 C)  Wt 199 lb (90.266 kg) Gen: NAD, resting comfortably CV: RRR no murmurs rubs or gallops Lungs: CTAB no crackles, wheeze, rhonchi Ext: no edema in arms Skin: warm, dry, no rash Neuro: 5/5 strength bilateral upper extremities and no decreased sensation  Assessment/Plan:   Cervical radiculopathy - Plan: Ambulatory referral to Neurosurgery S: left arm, neck, shoulder pain down to elbow. Trialed flexeril no help and tramadol and may be able to sleep. When lays down worsens the pain. At night gets to 4/10 and if lays down it is 6-7/10 pain. Has not slept well in 2.5 months and really wearing on patient.   Been to chiropractor 8-9x and was improving some. Worse after session but then would calm back down. Taking advil and bayer extra strength. Took turmeric. Went to accupuncturist and had poor experience  X-rays through [facility] without DDD. Thought to be C5 issue as does not pass elbow. Went to get MRI through chiropractor but had severe 8/10 pain when trying to lay all the way flat and could nto be completed.   Today here with [place] Nathaniel Manning [place] in [place]. Both prefer  patient be seen by Dr. Newell CoralNudelman  A/P: suspect left C5 nerve impingement. Will give 1x dose of percocet 10/325 to get him through MRI pain wise. Will start gabapentin and titrate up to 300mg  and update me in 10 days. Asked for quick turnaroudn on neurosurgery referral given severity of pain  Return precautions advised.   Orders Placed This Encounter  Procedures  . Ambulatory referral to Neurosurgery    Referral Priority:  Routine    Referral Type:  Surgical    Referral Reason:  Specialty Services Required    Requested Specialty:  Neurosurgery    Number of Visits Requested:  1    Meds ordered this encounter  Medications  . oxyCODONE-acetaminophen (PERCOCET) 10-325 MG tablet    Sig: Take 30 minutes before MRI cervical spine    Dispense:  1 tablet    Refill:  0  . gabapentin (NEURONTIN) 100 MG capsule    Sig: Take 1-3 capsules (100-300 mg total) by mouth at bedtime.    Dispense:  90 capsule    Refill:  3   The duration of face-to-face time during this visit was 25 minutes. Greater than 50% of this time was spent in counseling, explanation of diagnosis, planning of further management, and/or coordination of care.     Tana ConchStephen Keefe Zawistowski, MD

## 2016-02-19 ENCOUNTER — Ambulatory Visit
Admission: RE | Admit: 2016-02-19 | Discharge: 2016-02-19 | Disposition: A | Discharge status: Home / Self Care | Payer: BLUE CROSS/BLUE SHIELD | Source: Ambulatory Visit | Attending: Chiropractic Medicine | Admitting: Chiropractic Medicine

## 2016-02-21 ENCOUNTER — Telehealth: Payer: Self-pay | Admitting: Family Medicine

## 2016-02-21 NOTE — Telephone Encounter (Signed)
Pt states he has spoken with chiropractor already today and is in office with Dr Bettina GaviaNoodleman right now. He states thanks for getting back with him so quickly, but he will review the results with Dr Bettina GaviaNoodleman.

## 2016-02-21 NOTE — Telephone Encounter (Signed)
Pt would like to know the MRI results that was [facility]

## 2016-02-21 NOTE — Telephone Encounter (Signed)
Since I did not order test- results did not come to me but i was able to view them.   Study was degraded due to motion but from report   "There appears to be a moderately large disc protrusion on the left at C5-6 extending into the foramen."  Basically appears to have a protruding cervical disc that could be the cause of his pain- neurosurgery referral is appropriate as already done.

## 2016-02-23 ENCOUNTER — Other Ambulatory Visit: Payer: Self-pay | Admitting: Neurosurgery

## 2016-02-23 DIAGNOSIS — M542 Cervicalgia: Secondary | ICD-10-CM

## 2016-02-24 ENCOUNTER — Telehealth: Payer: Self-pay | Admitting: Family Medicine

## 2016-02-24 NOTE — Telephone Encounter (Signed)
Pt states Dr Bettina GaviaNoodleman had already instructed him to take 300mg  BID.  He states he has been doing so for 3 days now. I advised him to increase to 300mg  TID starting tomorrow and to call back with update on Monday.  He states the pain is severe, nothing is helping, and he can not even sleep.  He states he can get epidural in one week. I advised him that I would let you know the significance of the pain.

## 2016-02-24 NOTE — Telephone Encounter (Signed)
Pt is returning misty call °

## 2016-02-24 NOTE — Telephone Encounter (Signed)
Is he up to 300 mg dose (3 pills)?  If so start him on a 300mg  dose of gabapentin (1 pill of 300 or could take 3 of the 100mg  pills) to be taken twice a day for 3 days, then 3x a day for 3 days then report back next week (that would be 900mg  total per day)

## 2016-02-24 NOTE — Telephone Encounter (Signed)
Pt state that the gbapentin 100 mg is not working for that nerve pain.  Pt would like to know if there is something else that you can prescribe for pain and something to help him sleep.

## 2016-02-24 NOTE — Telephone Encounter (Signed)
Left message on voicemail for patient to call back. 

## 2016-03-02 ENCOUNTER — Other Ambulatory Visit: Payer: BLUE CROSS/BLUE SHIELD

## 2016-12-05 DIAGNOSIS — H524 Presbyopia: Secondary | ICD-10-CM | POA: Diagnosis not present

## 2016-12-05 DIAGNOSIS — H5201 Hypermetropia, right eye: Secondary | ICD-10-CM | POA: Diagnosis not present

## 2016-12-05 DIAGNOSIS — H52223 Regular astigmatism, bilateral: Secondary | ICD-10-CM | POA: Diagnosis not present

## 2017-06-05 DIAGNOSIS — J3089 Other allergic rhinitis: Secondary | ICD-10-CM | POA: Diagnosis not present

## 2017-06-05 DIAGNOSIS — J3081 Allergic rhinitis due to animal (cat) (dog) hair and dander: Secondary | ICD-10-CM | POA: Diagnosis not present

## 2017-06-05 DIAGNOSIS — J301 Allergic rhinitis due to pollen: Secondary | ICD-10-CM | POA: Diagnosis not present

## 2017-06-05 DIAGNOSIS — J339 Nasal polyp, unspecified: Secondary | ICD-10-CM | POA: Diagnosis not present

## 2017-12-20 DIAGNOSIS — J301 Allergic rhinitis due to pollen: Secondary | ICD-10-CM | POA: Diagnosis not present

## 2017-12-20 DIAGNOSIS — J339 Nasal polyp, unspecified: Secondary | ICD-10-CM | POA: Diagnosis not present

## 2017-12-20 DIAGNOSIS — J3081 Allergic rhinitis due to animal (cat) (dog) hair and dander: Secondary | ICD-10-CM | POA: Diagnosis not present

## 2017-12-20 DIAGNOSIS — J3089 Other allergic rhinitis: Secondary | ICD-10-CM | POA: Diagnosis not present

## 2017-12-20 MED FILL — LEVOCETIRIZINE 5 MG TABLET: 5 | 30 days supply | Qty: 30 | Fill #0

## 2017-12-20 MED FILL — MONTELUKAST SOD 10 MG TAB: 10 | 30 days supply | Qty: 30 | Fill #0

## 2017-12-20 MED FILL — AZELASTINE HCL 137 MCG SPRY: 0.1 | 30 days supply | Qty: 30 | Fill #0

## 2018-03-24 ENCOUNTER — Encounter: Payer: Self-pay | Admitting: Family Medicine

## 2018-03-24 ENCOUNTER — Ambulatory Visit: Payer: Self-pay | Admitting: Family Medicine

## 2018-03-24 VITALS — BP 144/90 | HR 98 | Temp 98.4°F | Ht 71.0 in | Wt 187.8 lb

## 2018-03-24 DIAGNOSIS — Z0000 Encounter for general adult medical examination without abnormal findings: Secondary | ICD-10-CM

## 2018-03-24 DIAGNOSIS — J00 Acute nasopharyngitis [common cold]: Secondary | ICD-10-CM

## 2018-03-24 MED ORDER — IPRATROPIUM BROMIDE 0.06 % NA SOLN: 2.0000 | Freq: Four times a day (QID) | NASAL | 12 refills | Status: DC

## 2018-03-24 NOTE — Progress Notes (Signed)
Sena Hitchaylor Chadwick Vega is a 47 y.o. male who presents today with concerns of need for employer physical exam.  Review of Systems  Constitutional: Negative for chills, fever and malaise/fatigue.  HENT: Negative for congestion, ear discharge, ear pain, sinus pain and sore throat.   Eyes: Negative.   Respiratory: Negative for cough, sputum production and shortness of breath.   Cardiovascular: Negative.  Negative for chest pain.  Gastrointestinal: Negative for abdominal pain, diarrhea, nausea and vomiting.  Genitourinary: Negative for dysuria, frequency, hematuria and urgency.  Musculoskeletal: Negative for myalgias.  Skin: Negative.   Neurological: Negative for headaches.  Endo/Heme/Allergies: Negative.   Psychiatric/Behavioral: Negative.     O: Vitals:   03/24/18 1525  BP: (!) 144/90  Pulse: 98  Temp: 98.4 F (36.9 C)  SpO2: 98%     Physical Exam  Constitutional: He is oriented to person, place, and time. Vital signs are normal. He appears well-developed and well-nourished. He is active.  Non-toxic appearance. He does not have a sickly appearance.  HENT:  Head: Normocephalic.  Right Ear: Hearing, tympanic membrane, external ear and ear canal normal.  Left Ear: Hearing, tympanic membrane, external ear and ear canal normal.  Nose: Nose normal.  Mouth/Throat: Uvula is midline and oropharynx is clear and moist.  Neck: Normal range of motion. Neck supple.  Cardiovascular: Normal rate, regular rhythm, normal heart sounds and normal pulses.  Pulmonary/Chest: Effort normal and breath sounds normal.  Abdominal: Soft. Bowel sounds are normal.  Musculoskeletal: Normal range of motion.  Lymphadenopathy:       Head (right side): No submental and no submandibular adenopathy present.       Head (left side): No submental and no submandibular adenopathy present.    He has no cervical adenopathy.  Neurological: He is alert and oriented to person, place, and time.  Psychiatric: He has a normal  mood and affect. His speech is normal and behavior is normal. Cognition and memory are normal.  PHQ-9-negative  Vitals reviewed.  A: 1. Physical exam    P: Exam findings, diagnosis etiology and medication use and indications reviewed with patient. Follow- Up and discharge instructions provided. No emergent/urgent issues found on exam.  Patient verbalized understanding of information provided and agrees with plan of care (POC), all questions answered.  1. Physical exam WNL

## 2018-03-24 NOTE — Patient Instructions (Signed)

## 2018-12-01 ENCOUNTER — Ambulatory Visit (INDEPENDENT_AMBULATORY_CARE_PROVIDER_SITE_OTHER): Payer: 59 | Admitting: Family Medicine

## 2018-12-01 ENCOUNTER — Encounter: Payer: Self-pay | Admitting: Family Medicine

## 2018-12-01 VITALS — BP 118/78 | HR 89 | Temp 98.7°F | Ht 70.5 in | Wt 189.4 lb

## 2018-12-01 DIAGNOSIS — Z0000 Encounter for general adult medical examination without abnormal findings: Secondary | ICD-10-CM

## 2018-12-01 DIAGNOSIS — Z800 Family history of malignant neoplasm of digestive organs: Secondary | ICD-10-CM | POA: Diagnosis not present

## 2018-12-01 DIAGNOSIS — Z1211 Encounter for screening for malignant neoplasm of colon: Secondary | ICD-10-CM | POA: Diagnosis not present

## 2018-12-01 NOTE — Progress Notes (Signed)
Phone: [phone]    Subjective:  Patient presents today for their annual physical. Chief complaint-noted.   See problem oriented charting- ROS- full  review of systems was completed and negative except for: runny nose, sneezing, cough, seasonal allergies  The following were reviewed and entered/updated in epic: Past Medical History:  Diagnosis Date  . Pulmonary embolism (HCC) 2008   After broken ankle/DVT that went to lung. Coumadin/lovenox 3-4 months.    Patient Active Problem List   Diagnosis Date Noted  . Pulmonary embolism (HCC)     Priority: High  . Erectile dysfunction 05/10/2015    Priority: Low   Past Surgical History:  Procedure Laterality Date  . ANKLE FRACTURE SURGERY     Dr. Luiz Blare    Family History  Problem Relation Age of Onset  . Other Mother        sinus cavity cancer- presented with eye issues. 6 months chemo/radiation- lost eye  . Alcohol abuse Father        estranged  . Colon cancer Father        was told this by dad's GF- died 61-70.   . Breast cancer Other        gma- 89,lived to 101    Medications- reviewed and updated No current outpatient medications on file.   No current facility-administered medications for this visit.     Allergies-reviewed and updated No Known Allergies  Social History   Social History Narrative   Single-Dating, lives with girlfriend (OR nurse). No kids. Considering pregnancy with girlfriend- if it happens it happens.       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 [employer]: active enjoys outside, used to race motorcycles, travel, enjoys cooking/foodie.       Objective:  BP 118/78 (BP Location: Right Arm, Patient Position: Sitting, Cuff Size: Normal)   Pulse 89   Temp 98.7 F (37.1 C) (Oral)   Ht 5' 10.5" (1.791 m)   Wt 189 lb 6.4 oz (85.9 kg)   SpO2 96%   BMI 26.79 kg/m  Gen: NAD, resting comfortably HEENT: Mucous membranes are moist. Oropharynx normal.  Erythema of nasal  turbinates noted with some yellow discharge.  Tympanic membranes normal. Neck: no thyromegaly CV: RRR no murmurs rubs or gallops Lungs: CTAB no crackles, wheeze, rhonchi Abdomen: soft/nontender/nondistended/normal bowel sounds. No rebound or guarding.  Ext: no edema Skin: warm, dry Neuro: grossly normal, moves all extremities, PERRLA     Assessment and Plan:  48 y.o. male presenting for annual physical.  Health Maintenance counseling: 1. Anticipatory guidance: Patient counseled regarding regular dental exams -q6 months, eye exams - wearing readers and seeing optho- gets some fatigue from a lot of cpu work,  avoiding smoking and second hand smoke , limiting alcohol to 2 beverages per day- about 7-10 a week.   2. Risk factor reduction:  Advised patient of need for regular exercise and diet rich and fruits and vegetables to reduce risk of heart attack and stroke. Exercise- super active with work, Presenter, broadcasting, 48 year old- was averaging around 7k steps when was wearing watch. Diet- down 10 lbs since neck surgery- lost weight at that time and has kept it off. Eating reasonably healthy diet.  Wt Readings from Last 3 Encounters:  12/01/18 189 lb 6.4 oz (85.9 kg)  03/24/18 187 lb 12.8 oz (85.2 kg)  02/17/16 199 lb (90.3 kg)  3. Immunizations/screenings/ancillary studies Immunization History  Administered Date(s) Administered  . Tdap 05/09/2015  4.  Prostate cancer screening-   No family history, start at age 89   5. Colon cancer screening - found out dad had colon cancer- will refer for screening now age 66.   6. Skin cancer screening/prevention-no dermatologist.advised regular sunscreen use. Denies worrisome, changing, or new skin lesions.  - will be getting more sun exposure as bought boat 7. Testicular cancer screening- advised monthly self exams  8. STD screening- patient opts out as monogamous 9. Never smoker  Status of chronic or acute concerns   #Erectile dysfunction- used sildenafil and  helpful- no longer needing that fortunately.    Has had on and off colds for months as well as allergies- saw allergist and tried some things but actually felt better when he stopped.   We discussed doing labs today- we opted to wait until next year to repeat per patient preference  Future Appointments  Date Time Provider Department Center  12/08/2019  9:40 AM Shelva Majestic, MD [facility]   Return in about 1 year (around 12/01/2019) for physical- schedule before you leave.  Lab/Order associations: Opts out of labs-agrees to these next year Preventative health care  Family history of colon cancer - Plan: Ambulatory referral to Gastroenterology  Screen for colon cancer - Plan: Ambulatory referral to Gastroenterology  Return precautions advised.   Tana Conch, MD

## 2018-12-01 NOTE — Patient Instructions (Addendum)
We decided to hold off on labs and repeat next year since you are doing so well  No changes today- other than perhaps being more consistent with sunscreen on the boat!

## 2019-03-09 DIAGNOSIS — R2231 Localized swelling, mass and lump, right upper limb: Secondary | ICD-10-CM | POA: Diagnosis not present

## 2019-03-09 DIAGNOSIS — M79641 Pain in right hand: Secondary | ICD-10-CM | POA: Diagnosis not present

## 2019-03-09 DIAGNOSIS — M720 Palmar fascial fibromatosis [Dupuytren]: Secondary | ICD-10-CM | POA: Diagnosis not present

## 2019-03-16 ENCOUNTER — Encounter: Payer: Self-pay | Admitting: Physical Therapy

## 2019-07-09 DIAGNOSIS — H524 Presbyopia: Secondary | ICD-10-CM | POA: Diagnosis not present

## 2019-07-09 DIAGNOSIS — H5203 Hypermetropia, bilateral: Secondary | ICD-10-CM | POA: Diagnosis not present

## 2019-07-09 DIAGNOSIS — H52223 Regular astigmatism, bilateral: Secondary | ICD-10-CM | POA: Diagnosis not present

## 2019-07-31 ENCOUNTER — Other Ambulatory Visit: Payer: Self-pay

## 2019-07-31 DIAGNOSIS — Z20822 Contact with and (suspected) exposure to covid-19: Secondary | ICD-10-CM

## 2019-08-01 LAB — NOVEL CORONAVIRUS, NAA: SARS-CoV-2, NAA: NOT DETECTED

## 2019-12-08 ENCOUNTER — Encounter: Payer: Self-pay | Admitting: Family Medicine

## 2019-12-08 ENCOUNTER — Other Ambulatory Visit: Payer: Self-pay

## 2019-12-08 ENCOUNTER — Ambulatory Visit (INDEPENDENT_AMBULATORY_CARE_PROVIDER_SITE_OTHER): Payer: 59 | Admitting: Family Medicine

## 2019-12-08 VITALS — BP 120/76 | HR 91 | Temp 97.2°F | Ht 71.0 in | Wt 204.0 lb

## 2019-12-08 DIAGNOSIS — Z800 Family history of malignant neoplasm of digestive organs: Secondary | ICD-10-CM | POA: Diagnosis not present

## 2019-12-08 DIAGNOSIS — E785 Hyperlipidemia, unspecified: Secondary | ICD-10-CM | POA: Diagnosis not present

## 2019-12-08 DIAGNOSIS — Z1211 Encounter for screening for malignant neoplasm of colon: Secondary | ICD-10-CM | POA: Diagnosis not present

## 2019-12-08 DIAGNOSIS — Z0000 Encounter for general adult medical examination without abnormal findings: Secondary | ICD-10-CM

## 2019-12-08 LAB — COMPREHENSIVE METABOLIC PANEL
ALT: 30 U/L (ref 0–53)
AST: 29 U/L (ref 0–37)
Albumin: 4.3 g/dL (ref 3.5–5.2)
Alkaline Phosphatase: 91 U/L (ref 39–117)
BUN: 13 mg/dL (ref 6–23)
CO2: 30 mEq/L (ref 19–32)
Calcium: 9.9 mg/dL (ref 8.4–10.5)
Chloride: 102 mEq/L (ref 96–112)
Creatinine, Ser: 1.09 mg/dL (ref 0.40–1.50)
GFR: 72.75 mL/min (ref >60.00)
Glucose, Bld: 83 mg/dL (ref 70–99)
Potassium: 4.9 mEq/L (ref 3.5–5.1)
Sodium: 139 mEq/L (ref 135–145)
Total Bilirubin: 0.4 mg/dL (ref 0.2–1.2)
Total Protein: 7.3 g/dL (ref 6.0–8.3)

## 2019-12-08 LAB — LIPID PANEL
Cholesterol: 252 mg/dL — ABNORMAL HIGH (ref 0–200)
HDL: 56.9 mg/dL (ref >39.00)
NonHDL: 194.95
Total CHOL/HDL Ratio: 4
Triglycerides: 393 mg/dL — ABNORMAL HIGH (ref 0.0–149.0)
VLDL: 78.6 mg/dL — ABNORMAL HIGH (ref 0.0–40.0)

## 2019-12-08 LAB — CBC WITH DIFFERENTIAL/PLATELET
Basophils Absolute: 0.1 10*3/uL (ref 0.0–0.1)
Basophils Relative: 1 % (ref 0.0–3.0)
Eosinophils Absolute: 0.4 10*3/uL (ref 0.0–0.7)
Eosinophils Relative: 5.1 % — ABNORMAL HIGH (ref 0.0–5.0)
HCT: 44.8 % (ref 39.0–52.0)
Hemoglobin: 15.3 g/dL (ref 13.0–17.0)
Lymphocytes Relative: 22.5 % (ref 12.0–46.0)
Lymphs Abs: 1.7 10*3/uL (ref 0.7–4.0)
MCHC: 34.1 g/dL (ref 30.0–36.0)
MCV: 90.5 fl (ref 78.0–100.0)
Monocytes Absolute: 0.6 10*3/uL (ref 0.1–1.0)
Monocytes Relative: 8.3 % (ref 3.0–12.0)
Neutro Abs: 4.7 10*3/uL (ref 1.4–7.7)
Neutrophils Relative %: 63.1 % (ref 43.0–77.0)
Platelets: 245 10*3/uL (ref 150.0–400.0)
RBC: 4.95 Mil/uL (ref 4.22–5.81)
RDW: 13.1 % (ref 11.5–15.5)
WBC: 7.5 10*3/uL (ref 4.0–10.5)

## 2019-12-08 LAB — LDL CHOLESTEROL, DIRECT: Direct LDL: 129 mg/dL

## 2019-12-08 NOTE — Progress Notes (Signed)
Phone: [phone]    Subjective:  Patient presents today for their annual physical. Chief complaint-noted.   See problem oriented charting- Review of Systems  Constitutional: Negative for chills and fever.  HENT: Negative for ear discharge, ear pain and hearing loss.   Eyes: Negative for blurred vision, double vision and photophobia.  Respiratory: Negative for cough, shortness of breath and wheezing.   Cardiovascular: Negative for chest pain, palpitations and leg swelling.  Gastrointestinal: Negative for constipation, diarrhea, heartburn, nausea and vomiting.  Genitourinary: Negative for dysuria, frequency and urgency.  Musculoskeletal: Negative for back pain, joint pain and neck pain.  Skin: Negative for rash.  Neurological: Negative for seizures, weakness and headaches.  Endo/Heme/Allergies: Does not bruise/bleed easily.  Psychiatric/Behavioral: Negative for depression, memory loss, substance abuse and suicidal ideas. The patient does not have insomnia.    The following were reviewed and entered/updated in epic: Past Medical History:  Diagnosis Date  . Pulmonary embolism ([place]) 2008   After broken ankle/DVT that went to lung. Coumadin/lovenox 3-4 months.    Patient Active Problem List   Diagnosis Date Noted  . History of pulmonary embolism     Priority: High  . Erectile dysfunction 05/10/2015    Priority: Low  . Dupuytren's contracture of right hand 03/09/2019   Past Surgical History:  Procedure Laterality Date  . ANKLE FRACTURE SURGERY     Dr. Berenice Primas    Family History  Problem Relation Age of Onset  . Other Mother        sinus cavity cancer- presented with eye issues. 6 months chemo/radiation- lost eye  . Alcohol abuse Father        estranged  . Colon cancer Father        was told this by dad's GF- died 60-70.   . Breast cancer Other        gma- 89,lived to 101    Medications- reviewed and updated No current outpatient medications on file.   No current  facility-administered medications for this visit.    Allergies-reviewed and updated No Known Allergies  Social History   Social History Narrative   Single-Dating, lives with girlfriend (OR nurse). No kids. Considering pregnancy with girlfriend- if it happens it happens.       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 [employer]: active enjoys outside, used to race motorcycles, travel, enjoys cooking/foodie.       Objective:  BP 120/76   Pulse 91   Temp (!) 97.2 F (36.2 C) (Temporal)   Ht 5\' 11"  (1.803 m)   Wt 204 lb (92.5 kg)   SpO2 96%   BMI 28.45 kg/m  Gen: NAD, resting comfortably HEENT: Mask not removed due to covid 19. TM normal. Bridge of nose normal. Eyelids normal.  Neck: no thyromegaly or cervical lymphadenopathy - other than 1 cm likely lymph node on left posterior neck CV: RRR no murmurs rubs or gallops Lungs: CTAB no crackles, wheeze, rhonchi Abdomen: soft/nontender/nondistended/normal bowel sounds. No rebound or guarding.  Ext: no edema Skin: warm, dry Neuro: grossly normal, moves all extremities, PERRLA     Assessment and Plan:  49 y.o. male presenting for annual physical.  Health Maintenance counseling: 1. Anticipatory guidance: Patient counseled regarding regular dental exams q6 months, eye exams-  yearly ,  avoiding smoking and second hand smoke , limiting alcohol to 2 beverages per day. 2 beers three times a week.   2. Risk factor reduction:  Advised patient of need for  regular exercise and diet rich and fruits and vegetables to reduce risk of heart attack and stroke. Exercise- exercises daily for about 30 min - Very active at work- also does some outside of work. Diet-tries to have healthy diet but some stress eating over last year and weight up 15 lbs. Thinks he can reverse this trend.   Wt Readings from Last 3 Encounters:  12/08/19 204 lb (92.5 kg)  12/01/18 189 lb 6.4 oz (85.9 kg)  03/24/18 187 lb 12.8 oz (85.2 kg)  3.  Immunizations/screenings/ancillary studies- he is considering covid 19 vaccine at a later date.  Immunization History  Administered Date(s) Administered  . Tdap 05/09/2015  4. Prostate cancer screening- no family history, start at age 32   5. Colon cancer screening - Referral started - due to fathers history of colon cancer.  6. Skin cancer screening/prevention- does not see Dermatology regularly. advised regular sunscreen use. Denies worrisome, changing, or new skin lesions- other than lesion on cervical area- appears to be enlarged lymph node around 1 cm 7. Testicular cancer screening- advised monthly self exams  8. STD screening- patient opts out. Married in monogamous relationship.  9. Never smoker-   Status of chronic or acute concerns   Hyperlipidemia- very high LDL on last check in 2016- we need to update today. hoing LDL under 190- patient would want to focus on diet/exercise before jumping on cholesterol medicine-  Lab Results  Component Value Date   CHOL 275 (H) 05/18/2015   HDL 60.00 05/18/2015   LDLCALC 191 (H) 05/18/2015   TRIG 120.0 05/18/2015   CHOLHDL 5 05/18/2015   Cervical lymphadenopathy posterior- thinks has been around a month-seems to come and go- he is going to set a reminder for 2 weeks and if not resolved or worsens will let us know and we can refer to ENT based off this note   Allergies doing a little better this year.   ED- no recent issues- thinks may have been higher stress- no issues with wife  Recommended follow up: Return in about 1 year (around 12/07/2020) for physical or sooner if needed.  Lab/Order associations: Protein drink    ICD-10-CM   1. Preventative health care  Z00.00 CBC with Differential/Platelet    Comprehensive metabolic panel    Lipid panel  2. Screen for colon cancer  Z12.11 Ambulatory referral to Gastroenterology  3. Hyperlipidemia, unspecified hyperlipidemia type  E78.5 CBC with Differential/Platelet    Comprehensive metabolic  panel    Lipid panel  4. Family history of colon cancer  Z80.0 Ambulatory referral to Gastroenterology    No orders of the defined types were placed in this encounter.   Return precautions advised.   Tana Conch, MD

## 2019-12-08 NOTE — Patient Instructions (Addendum)
Cervical lymphadenopathy posterior- thinks has been around a month-seems to come and go- he is going to set a reminder for 2 weeks and if not resolved or worsens will let us know and we can refer to ENT based off this note   We will call you within two weeks about your referral to Dr. [facility]. If you do not hear within 3 weeks, give [place] a call.   [email or web address] under high cholesterol  Please stop by lab before you go If you do not have mychart- we will call you about results within 5 business days of [place] receiving them.  If you have mychart- we will send your results within 3 business days of [place] receiving them.  If abnormal or we want to clarify a result, we will call or mychart you to make sure you receive the message.  If you have questions or concerns or don't hear within 5 business days, please send [place] a message or call us.   Recommended follow up: No follow-ups on file.

## 2020-04-08 ENCOUNTER — Encounter: Payer: Self-pay | Admitting: Family Medicine

## 2020-04-08 ENCOUNTER — Other Ambulatory Visit: Payer: Self-pay

## 2020-04-08 ENCOUNTER — Ambulatory Visit: Payer: 59 | Admitting: Family Medicine

## 2020-04-08 VITALS — BP 130/76 | HR 87 | Temp 98.7°F | Ht 71.0 in | Wt 200.0 lb

## 2020-04-08 DIAGNOSIS — L989 Disorder of the skin and subcutaneous tissue, unspecified: Secondary | ICD-10-CM | POA: Diagnosis not present

## 2020-04-08 DIAGNOSIS — M7551 Bursitis of right shoulder: Secondary | ICD-10-CM

## 2020-04-08 DIAGNOSIS — M7552 Bursitis of left shoulder: Secondary | ICD-10-CM | POA: Diagnosis not present

## 2020-04-08 MED ORDER — MELOXICAM 15 MG PO TABS: 15.0000 mg | ORAL_TABLET | Freq: Every day | ORAL | 0 refills | Status: DC

## 2020-04-08 NOTE — Patient Instructions (Addendum)
Health Maintenance Due  Topic Date Due  . Hepatitis C Screening will get at next blood draw  Never done  . COVID-19 Vaccine (1) declined  Never done   We will call you within two weeks about your referral to Dr. Christain [place]. If you do not hear within 3 weeks, give [place] a call.   Trial meloxicam for 1 week. I think this is either mild rotator cuff injury/tendonitis vs. Bursitis. Offered some home exercises- we can always send these to you by mail if you change your mind.  -Also lay off of any activities that tend to increase the pain -If no better in 2 or 3 weeks or symptoms worsen letter new referral to sports medicine or orthopedics   Recommended follow up: keep march visit or sooner if needed

## 2020-04-08 NOTE — Progress Notes (Signed)
Phone [phone] In person visit   Subjective:   Nathaniel Manning is a 49 y.o. year old very pleasant male patient who presents for/with See problem oriented charting Chief Complaint  Patient presents with  . Mass    on neck     This visit occurred during the SARS-CoV-2 public health emergency.  Safety protocols were in place, including screening questions prior to the visit, additional usage of staff PPE, and extensive cleaning of exam room while observing appropriate contact time as indicated for disinfecting solutions.   Past Medical History-  Patient Active Problem List   Diagnosis Date Noted  . History of pulmonary embolism     Priority: High  . Erectile dysfunction 05/10/2015    Priority: Low  . Dupuytren's contracture of right hand 03/09/2019    Medications- reviewed and updated Current Outpatient Medications  Medication Sig Dispense Refill  . meloxicam (MOBIC) 15 MG tablet Take 1 tablet (15 mg total) by mouth daily. 14 tablet 0   No current facility-administered medications for this visit.     Objective:  BP 130/76   Pulse 87   Temp 98.7 F (37.1 C) (Temporal)   Ht 5\' 11"  (1.803 m)   Wt 200 lb (90.7 kg)   SpO2 97%   BMI 27.89 kg/m  Gen: NAD, resting comfortably Left posterior neck-2 x 2 centimeter slightly raised area-does feel somewhat mobile MSK:-Pain with Neer and Hawkins test bilaterally.  Normal empty can test.  Good rotator cuff strength and range of motion.  Pain with abduction past about 110 degrees    Assessment and Plan  # lump on neck  S:back of left side of head. At base of skull on left side. Have noticed some swelling at lymph nodes under arms from time to time with certain deodorants but this always resolves. Not tender to touch. When he touches it feels like some thing is in it. Feels like it is a bit bigger then when fist noticed it.   A/P: At physical in March noted 1 cm area on the posterior neck that I thought may be cervical lymph  node that has been present for about a month-our plan was if did not resolve within 2 weeks to refer to ENT.  This area seems slightly larger to me but also less firm-honestly feels like a potential lipoma to me.  I still think we should refer to ENT for their expert opinion-refer to Dr. April.  Would like to get him in relatively soon  #Bilateral shoulder pain S:started about two weeks ago. Bilateral arms that start at neck and down both arms that stops at wrist. He does have history of cervical surgery but this does not feel like prior nerve related issues. He has been taking otc meds daily for pain. Pain right now is a 2/10 described as ache that can be sharp with lifting movement. Patient is in the middle of moving right now and has been doing a lot of lifting.   With abduction of arms above shoulders hurts from neck down into elbow area. Does not go down to fingertips. No loss of strength. No numbness or tingling. Pain 1-2/10.  Does admit to a lot of overhead lifting including very heavy objects-such as a sectional from a couch.  Also this is young son up a fair amount.   A/P: This honestly seems like bilateral bursitis or rotator cuff tendinitis.  Trial course of meloxicam-precautions about GI bleed, reflux, kidney issues discussed-I think he is young  and healthy and can likely tolerate short course.  Gave 14 days but want him to only start with 7.  If not improving within 2 to 3 weeks would like to refer to sports medicine.  He declined rotator cuff/bursitis handout for now as he does not think he has the time to work on this at present due to work.  Encouraged relative rest-ideally avoiding anything that worsens pain -I do not think this is related to lesion on posterior neck  Recommended follow up: Keep physical in March Future Appointments  Date Time Provider Department Center  12/08/2020  8:00 AM Shelva Majestic, MD [facility]    Lab/Order associations:   ICD-10-CM   1. Lesion of neck   L98.9 Ambulatory referral to ENT  2. Bursitis of both shoulders  M75.51    M75.52     Meds ordered this encounter  Medications  . meloxicam (MOBIC) 15 MG tablet    Sig: Take 1 tablet (15 mg total) by mouth daily.    Dispense:  14 tablet    Refill:  0   Time Spent: 30 minutes of total time (3:42 PM- 4:12 PM) was spent on the date of the encounter performing the following actions: chart review prior to seeing the patient, obtaining history, performing a medically necessary exam, counseling on the treatment plan, placing orders, and documenting in our EHR.    Return precautions advised.  Tana Conch, MD

## 2020-04-13 DIAGNOSIS — R590 Localized enlarged lymph nodes: Secondary | ICD-10-CM | POA: Diagnosis not present

## 2020-06-15 ENCOUNTER — Other Ambulatory Visit: Payer: Self-pay

## 2020-06-15 ENCOUNTER — Encounter: Payer: Self-pay | Admitting: Gastroenterology

## 2020-06-15 ENCOUNTER — Telehealth: Payer: Self-pay

## 2020-06-15 DIAGNOSIS — Z1211 Encounter for screening for malignant neoplasm of colon: Secondary | ICD-10-CM

## 2020-06-15 NOTE — Telephone Encounter (Signed)
Pt.'s wife is requesting a referral to Dr. Christella Hartigan at [facility] for the pt for his colonoscopy.

## 2020-06-15 NOTE — Telephone Encounter (Signed)
Referral has been placed. 

## 2020-07-15 DIAGNOSIS — M7021 Olecranon bursitis, right elbow: Secondary | ICD-10-CM | POA: Diagnosis not present

## 2020-07-15 DIAGNOSIS — M25521 Pain in right elbow: Secondary | ICD-10-CM | POA: Diagnosis not present

## 2020-07-21 DIAGNOSIS — M7021 Olecranon bursitis, right elbow: Secondary | ICD-10-CM | POA: Diagnosis not present

## 2020-08-05 ENCOUNTER — Ambulatory Visit (INDEPENDENT_AMBULATORY_CARE_PROVIDER_SITE_OTHER): Payer: 59 | Admitting: Gastroenterology

## 2020-08-05 ENCOUNTER — Encounter: Payer: Self-pay | Admitting: Gastroenterology

## 2020-08-05 DIAGNOSIS — Z800 Family history of malignant neoplasm of digestive organs: Secondary | ICD-10-CM | POA: Diagnosis not present

## 2020-08-05 DIAGNOSIS — Z1211 Encounter for screening for malignant neoplasm of colon: Secondary | ICD-10-CM

## 2020-08-05 MED ORDER — SUPREP BOWEL PREP KIT 17.5-3.13-1.6 GM/177ML PO SOLN: 1.0000 | ORAL | 0 refills | Status: DC

## 2020-08-05 NOTE — Patient Instructions (Signed)
If you are age 47 or older, your body mass index should be between 23-30. Your Body mass index is 27.53 kg/m. If this is out of the aforementioned range listed, please consider follow up with your Primary Care Provider.  If you are age 66 or younger, your body mass index should be between 19-25. Your Body mass index is 27.53 kg/m. If this is out of the aformentioned range listed, please consider follow up with your Primary Care Provider.   You have been scheduled for a colonoscopy. Please follow written instructions given to you at your visit today.  Please pick up your prep supplies at the pharmacy within the next 1-3 days. If you use inhalers (even only as needed), please bring them with you on the day of your procedure.  Due to recent changes in healthcare laws, you may see the results of your imaging and laboratory studies on MyChart before your provider has had a chance to review them.  We understand that in some cases there may be results that are confusing or concerning to you. Not all laboratory results come back in the same time frame and the provider may be waiting for multiple results in order to interpret others.  Please give [place] 48 hours in order for your provider to thoroughly review all the results before contacting the office for clarification of your results.   Thank you for entrusting me with your care and choosing [facility].  Dr Christella Hartigan

## 2020-08-05 NOTE — Progress Notes (Signed)
HPI: This is a very pleasant 49 year old man who was referred to me by Shelva Majestic, MD  to evaluate family history of colon cancer.    His father was diagnosed with colon cancer in his late 34s, early 40s and eventually died from the disease.  No other family members have had colon cancer  He himself has mild diet related bowel irregularities that has never really concerned him  He had no bleeding, no significant abdominal pains, no significant constipation or diarrhea.  His weight is overall stable.   Old Data Reviewed: Blood work March 2021 showed normal CBC    Review of systems: Pertinent positive and negative review of systems were noted in the above HPI section. All other review negative.   Past Medical History:  Diagnosis Date  . Pulmonary embolism (HCC) 2008   After broken ankle/DVT that went to lung. Coumadin/lovenox 3-4 months.     Past Surgical History:  Procedure Laterality Date  . ANKLE FRACTURE SURGERY     Dr. Luiz Blare  . LUMBAR SPINE SURGERY      Current Outpatient Medications  Medication Sig Dispense Refill  . acetaminophen (TYLENOL) 325 MG tablet Take 650 mg by mouth in the morning.     No current facility-administered medications for this visit.    Allergies as of 08/05/2020  . (No Known Allergies)    Family History  Problem Relation Age of Onset  . Other Mother        sinus cavity cancer- presented with eye issues. 6 months chemo/radiation- lost eye  . Alcohol abuse Father        estranged  . Colon cancer Father        was told this by dad's GF- died 9-70.   . Breast cancer Other        gma- 89,lived to 101  . Breast cancer Paternal Grandmother     Social History   Socioeconomic History  . Marital status: Single    Spouse name: Not on file  . Number of children: Not on file  . Years of education: Not on file  . Highest education level: Not on file  Occupational History  . Not on file  Tobacco Use  . Smoking status: Never Smoker   . Smokeless tobacco: Never Used  Vaping Use  . Vaping Use: Never used  Substance and Sexual Activity  . Alcohol use: Yes    Alcohol/week: 6.0 standard drinks    Types: 6 Standard drinks or equivalent per week    Comment: 1 drink per day  . Drug use: No  . Sexual activity: Not on file  Other Topics Concern  . Not on file  Social History Narrative   Single-Dating, lives with girlfriend (OR nurse). No kids. Considering pregnancy with girlfriend- if it happens it happens.       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 [employer]: active enjoys outside, used to race motorcycles, travel, enjoys cooking/foodie.    Social Determinants of Health   Financial Resource Strain:   . Difficulty of Paying Living Expenses: Not on file  Food Insecurity:   . Worried About Programme researcher, broadcasting/film/video in the Last Year: Not on file  . Ran Out of Food in the Last Year: Not on file  Transportation Needs:   . Lack of Transportation (Medical): Not on file  . Lack of Transportation (Non-Medical): Not on file  Physical Activity:   . Days of Exercise per Week:  Not on file  . Minutes of Exercise per Session: Not on file  Stress:   . Feeling of Stress : Not on file  Social Connections:   . Frequency of Communication with Friends and Family: Not on file  . Frequency of Social Gatherings with Friends and Family: Not on file  . Attends Religious Services: Not on file  . Active Member of Clubs or Organizations: Not on file  . Attends Banker Meetings: Not on file  . Marital Status: Not on file  Intimate Partner Violence:   . Fear of Current or Ex-Partner: Not on file  . Emotionally Abused: Not on file  . Physically Abused: Not on file  . Sexually Abused: Not on file     Physical Exam: Ht 5\' 11"  (1.803 m)   Wt 197 lb 6 oz (89.5 kg)   BMI 27.53 kg/m  Constitutional: generally well-appearing Psychiatric: alert and oriented x3 Eyes: extraocular movements intact Mouth: oral  pharynx moist, no lesions Neck: supple no lymphadenopathy Cardiovascular: heart regular rate and rhythm Lungs: clear to auscultation bilaterally Abdomen: soft, nontender, nondistended, no obvious ascites, no peritoneal signs, normal bowel sounds Extremities: no lower extremity edema bilaterally Skin: no lesions on visible extremities   Assessment and plan: 49 y.o. male with family history colon cancer  He understands that he is probably at somewhat increased risk for colon cancer given his family history.  I recommended colonoscopy at his soonest convenience and he understands that I will likely recommend repeat colonoscopy in 5 years even if the examination is completely normal.  I see no reason for any further blood tests or imaging studies prior to then.  Please see the "Patient Instructions" section for addition details about the plan.   51, MD  Gastroenterology 08/05/2020, 11:17 AM  Cc: 13/08/2020, MD  Total time on date of encounter was 30 minutes (this included time spent preparing to see the patient reviewing records; obtaining and/or reviewing separately obtained history; performing a medically appropriate exam and/or evaluation; counseling and educating the patient and family if present; ordering medications, tests or procedures if applicable; and documenting clinical information in the health record).

## 2020-09-28 ENCOUNTER — Ambulatory Visit (AMBULATORY_SURGERY_CENTER): Payer: 59 | Admitting: Gastroenterology

## 2020-09-28 ENCOUNTER — Other Ambulatory Visit: Payer: Self-pay

## 2020-09-28 ENCOUNTER — Encounter: Payer: Self-pay | Admitting: Gastroenterology

## 2020-09-28 VITALS — BP 118/78 | HR 65 | Temp 97.1°F | Resp 10 | Ht 71.0 in | Wt 197.0 lb

## 2020-09-28 DIAGNOSIS — Z800 Family history of malignant neoplasm of digestive organs: Secondary | ICD-10-CM | POA: Diagnosis not present

## 2020-09-28 DIAGNOSIS — Z1211 Encounter for screening for malignant neoplasm of colon: Secondary | ICD-10-CM | POA: Diagnosis not present

## 2020-09-28 MED ORDER — SODIUM CHLORIDE 0.9 % IV SOLN: 500.0000 mL | Freq: Once | INTRAVENOUS | Status: DC

## 2020-09-28 NOTE — Patient Instructions (Signed)
YOU HAD AN ENDOSCOPIC PROCEDURE TODAY AT THE [facility]:   Refer to the procedure report that was given to you for any specific questions about what was found during the examination.  If the procedure report does not answer your questions, please call your gastroenterologist to clarify.  If you requested that your care partner not be given the details of your procedure findings, then the procedure report has been included in a sealed envelope for you to review at your convenience later.  YOU SHOULD EXPECT: Some feelings of bloating in the abdomen. Passage of more gas than usual.  Walking can help get rid of the air that was put into your GI tract during the procedure and reduce the bloating. If you had a lower endoscopy (such as a colonoscopy or flexible sigmoidoscopy) you may notice spotting of blood in your stool or on the toilet paper. If you underwent a bowel prep for your procedure, you may not have a normal bowel movement for a few days.  Please Note:  You might notice some irritation and congestion in your nose or some drainage.  This is from the oxygen used during your procedure.  There is no need for concern and it should clear up in a day or so.  SYMPTOMS TO REPORT IMMEDIATELY:   Following lower endoscopy (colonoscopy or flexible sigmoidoscopy):  Excessive amounts of blood in the stool  Significant tenderness or worsening of abdominal pains  Swelling of the abdomen that is new, acute  Fever of 100F or higher  For urgent or emergent issues, a gastroenterologist can be reached at any hour by calling [phone]. Do not use MyChart messaging for urgent concerns.    DIET:  We do recommend a small meal at first, but then you may proceed to your regular diet.  Drink plenty of fluids but you should avoid alcoholic beverages for 24 hours.  ACTIVITY:  You should plan to take it easy for the rest of today and you should NOT DRIVE or use heavy machinery until tomorrow (because  of the sedation medicines used during the test).    FOLLOW UP: Our staff will call the number listed on your records 48-72 hours following your procedure to check on you and address any questions or concerns that you may have regarding the information given to you following your procedure. If we do not reach you, we will leave a message.  We will attempt to reach you two times.  During this call, we will ask if you have developed any symptoms of COVID 19. If you develop any symptoms (ie: fever, flu-like symptoms, shortness of breath, cough etc.) before then, please call [phone].  If you test positive for Covid 19 in the 2 weeks post procedure, please call and report this information to us.    If any biopsies were taken you will be contacted by phone or by letter within the next 1-3 weeks.  Please call us at [phone] if you have not heard about the biopsies in 3 weeks.    SIGNATURES/CONFIDENTIALITY: You and/or your care partner have signed paperwork which will be entered into your electronic medical record.  These signatures attest to the fact that that the information above on your After Visit Summary has been reviewed and is understood.  Full responsibility of the confidentiality of this discharge information lies with you and/or your care-partner. 

## 2020-09-28 NOTE — Op Note (Addendum)
[facility] Patient Name: Nathaniel Manning Procedure Date: 09/28/2020 1:21 PM MRN: [id number] Endoscopist: Milus Banister , MD Age: 50 Referring MD:  Date of Birth: 07/29/1971 Gender: Male Account #: [id number] Procedure:                Colonoscopy Indications:              Screening in patient at increased risk: Family                            history of 1st-degree relative with colorectal                            cancer (father in his early 5s) Medicines:                Monitored Anesthesia Care Procedure:                Pre-Anesthesia Assessment:                           - Prior to the procedure, a History and Physical                            was performed, and patient medications and                            allergies were reviewed. The patient's tolerance of                            previous anesthesia was also reviewed. The risks                            and benefits of the procedure and the sedation                            options and risks were discussed with the patient.                            All questions were answered, and informed consent                            was obtained. Prior Anticoagulants: The patient has                            taken no previous anticoagulant or antiplatelet                            agents. ASA Grade Assessment: II - A patient with                            mild systemic disease. After reviewing the risks                            and benefits, the patient was deemed in  satisfactory condition to undergo the procedure.                           After obtaining informed consent, the colonoscope                            was passed under direct vision. Throughout the                            procedure, the patient's blood pressure, pulse, and                            oxygen saturations were monitored continuously. The                            Olympus CF-[id number])  Colonoscope was                            introduced through the anus and advanced to the the                            cecum, identified by appendiceal orifice and                            ileocecal valve. The colonoscopy was performed                            without difficulty. The patient tolerated the                            procedure well. The quality of the bowel                            preparation was excellent. The ileocecal valve,                            appendiceal orifice, and rectum were photographed. Scope In: 1:25:10 PM Scope Out: 1:36:25 PM Scope Withdrawal Time: 0 hours 9 minutes 39 seconds  Total Procedure Duration: 0 hours 11 minutes 15 seconds  Findings:                 External and internal hemorrhoids were found. The                            hemorrhoids were medium-sized.                           The exam was otherwise without abnormality on                            direct and retroflexion views. Complications:            No immediate complications. Estimated blood loss:                            None. Estimated  Blood Loss:     Estimated blood loss: none. Impression:               - External and internal hemorrhoids.                           - The examination was otherwise normal on direct                            and retroflexion views.                           - No polyps or cancers. Recommendation:           - Patient has a contact number available for                            emergencies. The signs and symptoms of potential                            delayed complications were discussed with the                            patient. Return to normal activities tomorrow.                            Written discharge instructions were provided to the                            patient.                           - Resume previous diet.                           - Continue present medications.                           - Repeat colonoscopy  in 5 years for screening. Rachael Fee, MD 09/28/2020 1:39:26 PM This report has been signed electronically.

## 2020-09-28 NOTE — Progress Notes (Signed)
M.0. vital signs. 

## 2020-09-28 NOTE — Progress Notes (Signed)
Pt's states no medical or surgical changes since previsit or office visit. 

## 2020-09-28 NOTE — Progress Notes (Signed)
Report given to PACU, vss 

## 2020-09-30 ENCOUNTER — Telehealth: Payer: Self-pay

## 2020-09-30 NOTE — Telephone Encounter (Signed)
Attempted to reach pt. With follow-up call following endoscopic procedure 09/28/2020.  LM on pt.'s voice mail.  Will try to reach pt. Again later today.

## 2020-09-30 NOTE — Telephone Encounter (Signed)
Second post procedure follow up call, no answer 

## 2020-11-14 DIAGNOSIS — H5203 Hypermetropia, bilateral: Secondary | ICD-10-CM | POA: Diagnosis not present

## 2020-11-14 DIAGNOSIS — H52223 Regular astigmatism, bilateral: Secondary | ICD-10-CM | POA: Diagnosis not present

## 2020-11-14 DIAGNOSIS — H524 Presbyopia: Secondary | ICD-10-CM | POA: Diagnosis not present

## 2020-12-05 NOTE — Progress Notes (Signed)
Phone: [phone]    Subjective:  Patient presents today for their annual physical. Chief complaint-noted.   See problem oriented charting- ROS- full  review of systems was completed and negative  Per full ROS sheet  The following were reviewed and entered/updated in epic: Past Medical History:  Diagnosis Date   Pulmonary embolism (HCC) 2008   After broken ankle/DVT that went to lung. Coumadin/lovenox 3-4 months.    Patient Active Problem List   Diagnosis Date Noted   History of pulmonary embolism     Priority: High   Erectile dysfunction 05/10/2015    Priority: Low   Dupuytren's contracture of right hand 03/09/2019   Past Surgical History:  Procedure Laterality Date   ANKLE FRACTURE SURGERY     Dr. Luiz Blare   LUMBAR SPINE SURGERY      Family History  Problem Relation Age of Onset   Other Mother        sinus cavity cancer- presented with eye issues. 6 months chemo/radiation- lost eye   Alcohol abuse Father        estranged   Colon cancer Father        was told this by dad's GF- died 81-70.    Breast cancer Other        gma- 89,lived to 101   Breast cancer Paternal Grandmother     Medications- reviewed and updated Current Outpatient Medications  Medication Sig Dispense Refill   acetaminophen (TYLENOL) 325 MG tablet Take 650 mg by mouth in the morning.     No current facility-administered medications for this visit.    Allergies-reviewed and updated No Known Allergies  Social History   Social History Narrative   Married. Son Ty 67 years old 11/2020. Wife OR note float pool.        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 [employer]: active enjoys outside, used to race motorcycles, travel, enjoys cooking/foodie.       Objective:  BP 128/84    Pulse 81    Temp 97.6 F (36.4 C) (Temporal)    Ht 5\' 11"  (1.803 m)    Wt 198 lb 9.6 oz (90.1 kg)    SpO2 94%    BMI 27.70 kg/m  Gen: NAD, resting comfortably HEENT: Mucous  membranes are moist. Oropharynx normal Neck: no thyromegaly CV: RRR no murmurs rubs or gallops Lungs: CTAB no crackles, wheeze, rhonchi Abdomen: soft/nontender/nondistended/normal bowel sounds. No rebound or guarding.  Ext: no edema Skin: warm, dry Neuro: grossly normal, moves all extremities, PERRLA     Assessment and Plan:  50 y.o. male presenting for annual physical.  Health Maintenance counseling: 1. Anticipatory guidance: Patient counseled regarding regular dental exams -q6 months, eye exams - just saw recently- no major issues- [facility],  avoiding smoking and second hand smoke , limiting alcohol to 2 beverages per day- perhaps 1-2 a few days a week.  No illicit drugs 2. Risk factor reduction:  Advised patient of need for regular exercise and diet rich and fruits and vegetables to reduce risk of heart attack and stroke. Exercise- very active with his business and son- does not go to gym. Diet- he has improved diet over last year- trying to limit fast food and red meat, some cycles.  Last she was up 15 pounds but he has reverse this trend and is now down 7 pounds-congratulated him Wt Readings from Last 3 Encounters:  12/08/20 198 lb 9.6 oz (90.1 kg)  09/28/20 197 lb (89.4 kg)  08/05/20 197 lb 6 oz (89.5 kg)  3. Immunizations/screenings/ancillary studies-had covid vaccine- will call with dates. Opts out of flu shot  Discussed one-time lifetime hepatitis C screening  Immunization History  Administered Date(s) Administered   Tdap 05/09/2015  4. Prostate cancer screening-   No family history, start at age 69  5. Colon cancer screening - dad with colon cancer in late 60s (was estranged from father). Colonoscopy 09/28/20 with 5 year repeat  Due to family history 6. Skin cancer screening/prevention-does not see dermatology regularly. advised regular sunscreen use. Denies worrisome, changing, or new skin lesions.  1 cm enlarged lymph node last year in cervical area-  resolved until he examined behind left ear today- under 1 cm- will follow if changes he will let me know  7. Testicular cancer screening- advised monthly self exams  8. STD screening- patient opts out-married/monogamous 9.  Never smoker-   Status of chronic or acute concerns   #hyperlipidemia S: Medication:None Lab Results  Component Value Date   CHOL 252 (H) 12/08/2019   HDL 56.90 12/08/2019   LDLCALC 191 (H) 05/18/2015   LDLDIRECT 129.0 12/08/2019   TRIG 393.0 (H) 12/08/2019   CHOLHDL 4 12/08/2019   A/P: 10-year ASCVD risk only 2.5% last year-LDL peak at 191 but was at 129 last year  #Allergies-comes and goes- no major issues recently but as home inspector can flare up with exposures . Tries to get away   #ED-no recent issues-likely related to higher stress in the past   # Right olecranon bursitis- healed well without intervention  Recommended follow up: Return in about 1 year (around 12/08/2021) for physical or sooner if needed.  Lab/Order associations: fasting   ICD-10-CM   1. Preventative health care  Z00.00 CBC with Differential/Platelet    Comprehensive metabolic panel    Lipid panel    Hepatitis C antibody  2. Hyperlipidemia, unspecified hyperlipidemia type  E78.5 CBC with Differential/Platelet    Comprehensive metabolic panel    Lipid panel  3. Encounter for hepatitis C screening test for low risk patient  Z11.59 Hepatitis C antibody    No orders of the defined types were placed in this encounter.   Return precautions advised.   Tana Conch, MD

## 2020-12-05 NOTE — Patient Instructions (Addendum)
Please stop by lab before you go If you have mychart- we will send your results within 3 business days of [place] receiving them.  If you do not have mychart- we will call you about results within 5 business days of [place] receiving them.  *please also note that you will see labs on mychart as soon as they post. I will later go in and write notes on them- will say "notes from Dr. Durene Cal"  Health Maintenance Due  Topic Date Due  . COVID-19 Vaccine (1) Will call back with dates. Never done   Recommended follow up: Return in about 1 year (around 12/08/2021) for physical or sooner if needed.

## 2020-12-08 ENCOUNTER — Other Ambulatory Visit: Payer: Self-pay

## 2020-12-08 ENCOUNTER — Encounter: Payer: 59 | Admitting: Family Medicine

## 2020-12-08 ENCOUNTER — Ambulatory Visit (INDEPENDENT_AMBULATORY_CARE_PROVIDER_SITE_OTHER): Payer: 59 | Admitting: Family Medicine

## 2020-12-08 ENCOUNTER — Encounter: Payer: Self-pay | Admitting: Family Medicine

## 2020-12-08 VITALS — BP 128/84 | HR 81 | Temp 97.6°F | Ht 71.0 in | Wt 198.6 lb

## 2020-12-08 DIAGNOSIS — Z1159 Encounter for screening for other viral diseases: Secondary | ICD-10-CM

## 2020-12-08 DIAGNOSIS — Z0000 Encounter for general adult medical examination without abnormal findings: Secondary | ICD-10-CM

## 2020-12-08 DIAGNOSIS — E785 Hyperlipidemia, unspecified: Secondary | ICD-10-CM

## 2020-12-08 LAB — CBC WITH DIFFERENTIAL/PLATELET
Basophils Absolute: 0.1 10*3/uL (ref 0.0–0.1)
Basophils Relative: 1.1 % (ref 0.0–3.0)
Eosinophils Absolute: 0.4 10*3/uL (ref 0.0–0.7)
Eosinophils Relative: 5.1 % — ABNORMAL HIGH (ref 0.0–5.0)
HCT: 44.1 % (ref 39.0–52.0)
Hemoglobin: 15.5 g/dL (ref 13.0–17.0)
Lymphocytes Relative: 21.1 % (ref 12.0–46.0)
Lymphs Abs: 1.5 10*3/uL (ref 0.7–4.0)
MCHC: 35.1 g/dL (ref 30.0–36.0)
MCV: 89.1 fl (ref 78.0–100.0)
Monocytes Absolute: 0.5 10*3/uL (ref 0.1–1.0)
Monocytes Relative: 7.1 % (ref 3.0–12.0)
Neutro Abs: 4.8 10*3/uL (ref 1.4–7.7)
Neutrophils Relative %: 65.6 % (ref 43.0–77.0)
Platelets: 214 10*3/uL (ref 150.0–400.0)
RBC: 4.94 Mil/uL (ref 4.22–5.81)
RDW: 13 % (ref 11.5–15.5)
WBC: 7.3 10*3/uL (ref 4.0–10.5)

## 2020-12-08 LAB — COMPREHENSIVE METABOLIC PANEL
ALT: 37 U/L (ref 0–53)
AST: 36 U/L (ref 0–37)
Albumin: 4.4 g/dL (ref 3.5–5.2)
Alkaline Phosphatase: 80 U/L (ref 39–117)
BUN: 9 mg/dL (ref 6–23)
CO2: 26 mEq/L (ref 19–32)
Calcium: 10.1 mg/dL (ref 8.4–10.5)
Chloride: 101 mEq/L (ref 96–112)
Creatinine, Ser: 0.96 mg/dL (ref 0.40–1.50)
GFR: 94.31 mL/min (ref >60.00)
Glucose, Bld: 89 mg/dL (ref 70–99)
Potassium: 4.1 mEq/L (ref 3.5–5.1)
Sodium: 137 mEq/L (ref 135–145)
Total Bilirubin: 0.5 mg/dL (ref 0.2–1.2)
Total Protein: 7.3 g/dL (ref 6.0–8.3)

## 2020-12-08 LAB — LDL CHOLESTEROL, DIRECT: Direct LDL: 149 mg/dL

## 2020-12-08 LAB — LIPID PANEL
Cholesterol: 287 mg/dL — ABNORMAL HIGH (ref 0–200)
HDL: 63.5 mg/dL (ref >39.00)
Total CHOL/HDL Ratio: 5
Triglycerides: 477 mg/dL — ABNORMAL HIGH (ref 0.0–149.0)

## 2020-12-09 LAB — HEPATITIS C ANTIBODY
Hepatitis C Ab: NONREACTIVE
SIGNAL TO CUT-OFF: 0.02 (ref <1.00)

## 2021-03-16 ENCOUNTER — Other Ambulatory Visit: Payer: Self-pay

## 2021-03-16 ENCOUNTER — Encounter (HOSPITAL_BASED_OUTPATIENT_CLINIC_OR_DEPARTMENT_OTHER): Payer: Self-pay | Admitting: *Deleted

## 2021-03-16 ENCOUNTER — Telehealth: Payer: Self-pay

## 2021-03-16 ENCOUNTER — Emergency Department (HOSPITAL_BASED_OUTPATIENT_CLINIC_OR_DEPARTMENT_OTHER)
Admission: EM | Admit: 2021-03-16 | Discharge: 2021-03-16 | Disposition: A | Discharge status: Home / Self Care | Payer: 59 | Attending: Emergency Medicine | Admitting: Emergency Medicine

## 2021-03-16 ENCOUNTER — Emergency Department (HOSPITAL_BASED_OUTPATIENT_CLINIC_OR_DEPARTMENT_OTHER): Payer: 59 | Admitting: Radiology

## 2021-03-16 DIAGNOSIS — R079 Chest pain, unspecified: Secondary | ICD-10-CM | POA: Diagnosis not present

## 2021-03-16 DIAGNOSIS — R0789 Other chest pain: Secondary | ICD-10-CM | POA: Diagnosis not present

## 2021-03-16 LAB — CBC WITH DIFFERENTIAL/PLATELET
Abs Immature Granulocytes: 0.03 10*3/uL (ref 0.00–0.07)
Basophils Absolute: 0.1 10*3/uL (ref 0.0–0.1)
Basophils Relative: 1 %
Eosinophils Absolute: 0.3 10*3/uL (ref 0.0–0.5)
Eosinophils Relative: 5 %
HCT: 44 % (ref 39.0–52.0)
Hemoglobin: 15 g/dL (ref 13.0–17.0)
Immature Granulocytes: 1 %
Lymphocytes Relative: 27 %
Lymphs Abs: 1.6 10*3/uL (ref 0.7–4.0)
MCH: 30.2 pg (ref 26.0–34.0)
MCHC: 34.1 g/dL (ref 30.0–36.0)
MCV: 88.5 fL (ref 80.0–100.0)
Monocytes Absolute: 0.5 10*3/uL (ref 0.1–1.0)
Monocytes Relative: 9 %
Neutro Abs: 3.5 10*3/uL (ref 1.7–7.7)
Neutrophils Relative %: 57 %
Platelets: 246 10*3/uL (ref 150–400)
RBC: 4.97 MIL/uL (ref 4.22–5.81)
RDW: 12.4 % (ref 11.5–15.5)
WBC: 6.1 10*3/uL (ref 4.0–10.5)
nRBC: 0 % (ref 0.0–0.2)

## 2021-03-16 LAB — BASIC METABOLIC PANEL
Anion gap: 14 (ref 5–15)
BUN: 11 mg/dL (ref 6–20)
CO2: 24 mmol/L (ref 22–32)
Calcium: 9.8 mg/dL (ref 8.9–10.3)
Chloride: 104 mmol/L (ref 98–111)
Creatinine, Ser: 0.93 mg/dL (ref 0.61–1.24)
GFR, Estimated: >60 mL/min (ref >60)
Glucose, Bld: 88 mg/dL (ref 70–99)
Potassium: 3.8 mmol/L (ref 3.5–5.1)
Sodium: 142 mmol/L (ref 135–145)

## 2021-03-16 LAB — D-DIMER, QUANTITATIVE: D-Dimer, Quant: <0.27 ug/mL-FEU (ref 0.00–0.50)

## 2021-03-16 LAB — TROPONIN I (HIGH SENSITIVITY): Troponin I (High Sensitivity): 2 ng/L (ref <18)

## 2021-03-16 NOTE — Discharge Instructions (Addendum)
If you develop recurrent, continued, or worsening chest pain, shortness of breath, fever, vomiting, abdominal or back pain, or any other new/concerning symptoms then return to the ER for evaluation.  

## 2021-03-16 NOTE — ED Notes (Signed)
Patient verbalizes understanding of discharge instructions. Opportunity for questioning and answers were provided. Patient discharged from ED.  °

## 2021-03-16 NOTE — Telephone Encounter (Signed)
Noted  

## 2021-03-16 NOTE — Telephone Encounter (Signed)
Pt called to update Dr Durene Cal that he has bruised rib and enflamed lung. He will call back to schedule an appt in a few weeks for a follow up.

## 2021-03-16 NOTE — ED Triage Notes (Signed)
Chest pain and shortness of breath for 2 days 2/10.

## 2021-03-16 NOTE — ED Provider Notes (Signed)
[facility] EMERGENCY DEPT Provider Note   CSN: [id number] Arrival date & time: 03/16/21  1205     History Chief Complaint  Patient presents with   Chest Pain    Nathaniel Manning is a 50 y.o. male.  HPI 50 year old male presents with chest pain.  Started a couple days ago.  Has been waxing and waning since but is a continuous feeling.  Is a sharp pain in his left lateral chest.  He was playing on a slip and slide and injured himself last week but this pain started just a couple days ago.  Deep inspiration makes it worse.  No trouble breathing but he is more fatigued, not sure if that is Manning to the heat.  No leg swelling or abdominal pain.  Has taken Tylenol, ibuprofen without much relief.  Pain is about a 2 out of 10 currently.  Has a remote history of a PE which felt dramatically worse than this.  Past Medical History:  Diagnosis Date   Pulmonary embolism (HCC) 2008   After broken ankle/DVT that went to lung. Coumadin/lovenox 3-4 months.     Patient Active Problem List   Diagnosis Date Noted   Dupuytren's contracture of right hand 03/09/2019   Erectile dysfunction 05/10/2015   History of pulmonary embolism     Past Surgical History:  Procedure Laterality Date   ANKLE FRACTURE SURGERY     Dr. Luiz Blare   LUMBAR SPINE SURGERY         Family History  Problem Relation Age of Onset   Other Mother        sinus cavity cancer- presented with eye issues. 6 months chemo/radiation- lost eye   Alcohol abuse Father        estranged   Colon cancer Father        was told this by dad's GF- died 6-70.    Breast cancer Other        gma- 89,lived to 101   Breast cancer Paternal Grandmother     Social History   Tobacco Use   Smoking status: Never   Smokeless tobacco: Never  Vaping Use   Vaping Use: Never used  Substance Use Topics   Alcohol use: Yes    Alcohol/week: 6.0 standard drinks    Types: 6 Standard drinks or equivalent per week    Comment: every  other day   Drug use: No    Home Medications Prior to Admission medications   Medication Sig Start Date End Date Taking? Authorizing Provider  acetaminophen (TYLENOL) 325 MG tablet Take 650 mg by mouth in the morning.    [provider]    Allergies    Patient has no known allergies.  Review of Systems   Review of Systems  Respiratory:  Negative for shortness of breath.   Cardiovascular:  Positive for chest pain. Negative for leg swelling.  Gastrointestinal:  Negative for abdominal pain.  All other systems reviewed and are negative.  Physical Exam Updated Vital Signs BP (!) 148/98 (BP Location: Right Arm)   Pulse 84   Temp 98.4 F (36.9 C) (Oral)   Resp 16   Ht 5\' 11"  (1.803 m)   Wt 86.2 kg   SpO2 98%   BMI 26.50 kg/m   Physical Exam Vitals and nursing note reviewed.  Constitutional:      General: He is not in acute distress.    Appearance: He is well-developed. He is not ill-appearing or diaphoretic.  HENT:  Head: Normocephalic and atraumatic.     Right Ear: External ear normal.     Left Ear: External ear normal.     Nose: Nose normal.  Eyes:     General:        Right eye: No discharge.        Left eye: No discharge.  Cardiovascular:     Rate and Rhythm: Normal rate and regular rhythm.     Heart sounds: Normal heart sounds.  Pulmonary:     Effort: Pulmonary effort is normal.     Breath sounds: Normal breath sounds.  Chest:     Chest wall: No tenderness.  Abdominal:     Palpations: Abdomen is soft.     Tenderness: There is no abdominal tenderness.  Musculoskeletal:     Cervical back: Neck supple.     Right lower leg: No edema.     Left lower leg: No edema.  Skin:    General: Skin is warm and dry.  Neurological:     Mental Status: He is alert.  Psychiatric:        Mood and Affect: Mood is not anxious.    ED Results / Procedures / Treatments   Labs (all labs ordered are listed, but only abnormal results are displayed) Labs Reviewed   BASIC METABOLIC PANEL  CBC WITH DIFFERENTIAL/PLATELET  D-DIMER, QUANTITATIVE  TROPONIN I (HIGH SENSITIVITY)    EKG EKG Interpretation  Date/Time:  Thursday March 16 2021 12:10:25 EDT Ventricular Rate:  76 PR Interval:  137 QRS Duration: 98 QT Interval:  381 QTC Calculation: 429 R Axis:   69 Text Interpretation: Sinus rhythm no acute ST/T changes Confirmed by Pricilla Loveless [phone]) on 03/16/2021 12:14:55 PM  Radiology DG Chest 2 View  Result Date: 03/16/2021 CLINICAL DATA:  Left-sided chest pain for 2 days EXAM: CHEST - 2 VIEW COMPARISON:  02/20/2008 FINDINGS: The heart size and mediastinal contours are within normal limits. Both lungs are clear. The visualized skeletal structures are unremarkable. IMPRESSION: No acute abnormality of the lungs. Electronically Signed   By: Lauralyn Primes M.D.   On: 03/16/2021 13:07    Procedures Procedures   Medications Ordered in ED Medications - No data to display  ED Course  I have reviewed the triage vital signs and the nursing notes.  Pertinent labs & imaging results that were available during my care of the patient were reviewed by me and considered in my medical decision making (see chart for details).    MDM Rules/Calculators/A&P                          Patient with multiple days of left-sided chest pain.  ECG without acute ischemia.  Troponin is negative and after several days of symptoms I do not think repeat is needed.  No obvious findings on chest x-ray.  He has a history of PE though this does not sound consistent with PE in general with stable vitals besides some mild hypertension.  Thus a D-dimer was sent and is negative and I do not think further work-up is needed.  Highly doubt dissection.  At this point I will recommend more regular NSAIDs and have him follow-up with PCP.  He otherwise feels like this was an injury from the slip and slide, which is possible and might just be an MSK problem.  Given return precautions. Final  Clinical Impression(s) / ED Diagnoses Final diagnoses:  Left-sided chest pain    Rx /  DC Orders ED Discharge Orders     None        Pricilla Loveless, MD 03/16/21 1344

## 2021-03-16 NOTE — Telephone Encounter (Signed)
Thankfully went to ER and no PE based off negative D dimer. Please call to check on him- can schedule follow up for next week with same day if he desires and we have available

## 2021-03-16 NOTE — Telephone Encounter (Signed)
Nurse Assessment Nurse: Elesa Hacker, RN, Nash Dimmer Date/Time Lamount Cohen Time): 03/16/2021 9:40:17 AM Confirm and document reason for call. If symptomatic, describe symptoms. ---Caller states he has been experiencing left side chest pain. Caller states he is not sure if the pain is caused by a injury or something else. Caller states he is also experiencing shortness of breath. Started a couple days ago, constant pain, 3/10. Does the patient have any new or worsening symptoms? ---Yes Will a triage be completed? ---Yes Related visit to physician within the last 2 weeks? ---No Does the PT have any chronic conditions? (i.e. diabetes, asthma, this includes High risk factors for pregnancy, etc.) ---No Is this a behavioral health or substance abuse call? ---No Guidelines Guideline Title Affirmed Question Affirmed Notes Nurse Date/Time Lamount Cohen Time) Chest Pain [1] Chest pain lasts > 5 minutes AND [2] age > 55 Deaton, RN, Nash Dimmer 03/16/2021 9:41:42 AM PLEASE NOTE: All timestamps contained within this report are represented as [place] Standard Time. CONFIDENTIALTY NOTICE: This fax transmission is intended only for the addressee. It contains information that is legally privileged, confidential or otherwise protected from use or disclosure. If you are not the intended recipient, you are strictly prohibited from reviewing, disclosing, copying using or disseminating any of this information or taking any action in reliance on or regarding this information. If you have received this fax in error, please notify us immediately by telephone so that we can arrange for its return to [place]. Phone: [phone], Toll-Free: [phone], Fax: [phone] Page: 2 of 2 Call Id: [id number] Disp. Time Lamount Cohen Time) Disposition Final User 03/16/2021 9:35:51 AM Send to Urgent Mammie Russian 03/16/2021 9:47:20 AM 911 Outcome Documentation Deaton, RN, Nash Dimmer Reason: Caller refused to call EMS, going by POV to the  ED. 03/16/2021 9:45:55 AM Call EMS 911 Now Yes Deaton, RN, Cory Roughen Disagree/Comply Disagree Caller Understands Yes PreDisposition Did not know what to do Care Advice Given Per Guideline CALL EMS 911 NOW: * Immediate medical attention is needed. You need to hang up and call 911 (or an ambulance). * Triager Discretion: I'll call you back in a few minutes to be sure you were able to reach them. CARE ADVICE given per Chest Pain (Adult) guideline. NOTE TO TRIAGER - IF CALLER ASKS ABOUT ASPIRIN: * Call EMS 911 first. Comments User: Wandra Scot, RN Date/Time Lamount Cohen Time): 03/16/2021 9:42:39 AM Caller advised that he has a hx of PE . Referrals GO TO FACILITY OTHER - [facility]

## 2021-03-16 NOTE — Telephone Encounter (Signed)
FYI; I lvm with the pt to address concerns below.

## 2021-12-12 ENCOUNTER — Encounter: Payer: Self-pay | Admitting: Family Medicine

## 2021-12-12 ENCOUNTER — Ambulatory Visit (INDEPENDENT_AMBULATORY_CARE_PROVIDER_SITE_OTHER): Payer: No Typology Code available for payment source | Admitting: Family Medicine

## 2021-12-12 VITALS — BP 104/70 | HR 78 | Temp 97.7°F | Ht 71.0 in | Wt 192.6 lb

## 2021-12-12 DIAGNOSIS — Z0000 Encounter for general adult medical examination without abnormal findings: Secondary | ICD-10-CM

## 2021-12-12 DIAGNOSIS — Z125 Encounter for screening for malignant neoplasm of prostate: Secondary | ICD-10-CM | POA: Diagnosis not present

## 2021-12-12 DIAGNOSIS — M79661 Pain in right lower leg: Secondary | ICD-10-CM

## 2021-12-12 DIAGNOSIS — E785 Hyperlipidemia, unspecified: Secondary | ICD-10-CM

## 2021-12-12 NOTE — Progress Notes (Signed)
?Phone: [phone] ?  ?Subjective:  ?Patient presents today for their annual physical. Chief complaint-noted.  ? ?See problem oriented charting- ?ROS- full  review of systems was completed and negative  ?except for: allergies ? ?The following were reviewed and entered/updated in epic: ?Past Medical History:  ?Diagnosis Date  ? Pulmonary embolism (HCC) 2008  ? After broken ankle/DVT that went to lung. Coumadin/lovenox 3-4 months.   ? ?Patient Active Problem List  ? Diagnosis Date Noted  ? History of pulmonary embolism   ?  Priority: High  ? Hyperlipidemia 12/12/2021  ?  Priority: Medium   ? Erectile dysfunction 05/10/2015  ?  Priority: Low  ? Dupuytren's contracture of right hand 03/09/2019  ? ?Past Surgical History:  ?Procedure Laterality Date  ? ANKLE FRACTURE SURGERY    ? Dr. Luiz Blare  ? LUMBAR SPINE SURGERY    ? ? ?Family History  ?Problem Relation Age of Onset  ? Other Mother   ?     sinus cavity cancer- presented with eye issues. 6 months chemo/radiation- lost eye  ? Alcohol abuse Father   ?     estranged  ? Colon cancer Father   ?     was told this by dad's GF- died 41-70.   ? Breast cancer Other   ?     gma- 89,lived to 101  ? Breast cancer Paternal Grandmother   ? ? ?Medications- reviewed and updated ?Current Outpatient Medications  ?Medication Sig Dispense Refill  ? acetaminophen (TYLENOL) 325 MG tablet Take 650 mg by mouth in the morning. (Patient not taking: Reported on 12/12/2021)    ? ?No current facility-administered medications for this visit.  ? ? ?Allergies-reviewed and updated ?No Known Allergies ? ?Social History  ? ?Social History Narrative  ? Married. Son Ty 5 years old 11/2020. Wife OR note float pool.    ?   ? Engineer, mining- active. Owns own business. [employer] under[employer]  ?   ? Hobbies: active enjoys outside, used to race motorcycles, travel, enjoys cooking/foodie.   ? ?Objective  ?Objective:  ?BP 104/70   Pulse 78   Temp 97.7 ?F (36.5 ?C)   Ht 5\' 11"  (1.803 m)   Wt 192 lb 9.6 oz (87.4 kg)    SpO2 97%   BMI 26.86 kg/m?  ?Gen: NAD, resting comfortably ?HEENT: Mucous membranes are moist. Oropharynx normal ?Neck: no thyromegaly ?CV: RRR no murmurs rubs or gallops ?Lungs: CTAB no crackles, wheeze, rhonchi ?Abdomen: soft/nontender/nondistended/normal bowel sounds. No rebound or guarding.  ?Ext: no edema ?Skin: warm, dry ?Neuro: grossly normal, moves all extremities, PERRLA ?No pain with palpation in calf in area of reported pain ?  ?Assessment and Plan  ?51 y.o. male presenting for annual physical.  ?Health Maintenance counseling: ?1. Anticipatory guidance: Patient counseled regarding regular dental exams -q6 months, eye exams - seen recently- usually goes yearly,  avoiding smoking and second hand smoke , limiting alcohol to 2 beverages per day .   ?2. Risk factor reduction:  Advised patient of need for regular exercise and diet rich and fruits and vegetables to reduce risk of heart attack and stroke.  ?Exercise- very active with work- well over 10k steps a day even without work- walks with dog, plays tennis.  ?Diet/weight management-stable reasonable weight- trended down slightly since last yera. Continue gradual weight loss ?Wt Readings from Last 3 Encounters:  ?12/12/21 192 lb 9.6 oz (87.4 kg)  ?03/16/21 190 lb (86.2 kg)  ?12/08/20 198 lb 9.6 oz (90.1 kg)  ?3.  Immunizations/screenings/ancillary studies-declines COVID-vaccine and flu shot- no recent covid   ?Immunization History  ?Administered Date(s) Administered  ? Tdap 05/09/2015  ?4. Prostate cancer screening- no family history, start at age 64  ? 5. Colon cancer screening - colonoscopy 09/28/2020 with 5-year repeat due to family history ?6. Skin cancer screening-does not see dermatology regularly.  Does have 1 enlarged lymph node behind the left ear but under 1 cm still- seems to come and go in size.  advised regular sunscreen use. Denies worrisome, changing, or new skin lesions.  ?7. Smoking associated screening (lung cancer screening, AAA screen  65-75, UA)-never smoker ?8. STD screening - monogamous ? ?Status of chronic or acute concerns  ? ?#social update- trip to [place] beach coming up- first vacation as a family - on the beach for sons birthday!  ? ?#hyperlipidemia ?S: Medication:none  ?Lab Results  ?Component Value Date  ? CHOL 287 (H) 12/08/2020  ? HDL 63.50 12/08/2020  ? LDLCALC 191 (H) 05/18/2015  ? LDLDIRECT 149.0 12/08/2020  ? TRIG (H) 12/08/2020  ?  477.0 Triglyceride is over 400; calculations on Lipids are invalid.  ? CHOLHDL 5 12/08/2020  ? A/P:  The 10-year ASCVD risk score (Arnett DK, et al., 2019) is: 2.6% when LDL came down-peak of 191 in 2016 ?-We discussed doing fasting labs with prior triglycerides over 400-also discussed with LDL were to go back over 190 would need to consider medication ?- would prefer to work on lifestyle ? ?#Allergies-tends to come and go.  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 ?-had seen allergist and had been on 6 different medications- did not make a big difference- stable without meds ? ?#ED-no recent issues-likely related to stress in the past-discontinue asking after this year  ? ?#Right calf pain- dull pain for about a month and a half comes and goes- doesn't feel like he pulled anything. Feels deeper. Doesn't hurt when pushes on it. Sometimes hurts even at rest. Does not wake him from sleep ?-with PE/DVT history (though related to broken ankle/ and thought provoked) will check d dimer to be on safe side- ultrasound if positive ?- with stability do not think need stat labs- ok with doing tomorrow- he would obviously let us know if worsening pain or shortness of breath or chest pain ? ?Recommended follow up: Return in about 1 year (around 12/13/2022) for physical or sooner if needed.Schedule b4 you leave. ? ?Lab/Order associations:NOT fasting- will come back fasting ?  ICD-10-CM   ?1. Preventative health care  Z00.00 CBC with Differential/Platelet  ?  Comprehensive metabolic panel  ?  Lipid panel   ?  D-Dimer, Quantitative  ?  Ambulatory referral to Sports Medicine  ?  ?2. Hyperlipidemia, unspecified hyperlipidemia type  E78.5 CBC with Differential/Platelet  ?  Comprehensive metabolic panel  ?  Lipid panel  ?  ?3. Screening for prostate cancer  Z12.5   ?  ?4. Right calf pain  M79.661 D-Dimer, Quantitative  ?  Ambulatory referral to Sports Medicine  ?  ? ? ?No orders of the defined types were placed in this encounter. ? ? ?Return precautions advised.  ?Tana Conch, MD ? ? ?

## 2021-12-12 NOTE — Patient Instructions (Addendum)
We will call you within two weeks about your referral to sports medicine. If you do not hear within 2 weeks, give [place] a call.   ? ?Schedule a lab visit at the check out desk for  tomorrow. Return for future fasting labs meaning nothing but water after midnight please. Ok to take your medications with water.  ? ?Great job on gradual weight loss ? ?If d dimer is high then we will check ultrasound of leg more urgently ? ?Recommended follow up: Return in about 1 year (around 12/13/2022) for physical or sooner if needed.Schedule b4 you leave. ?

## 2021-12-13 ENCOUNTER — Other Ambulatory Visit (INDEPENDENT_AMBULATORY_CARE_PROVIDER_SITE_OTHER): Payer: No Typology Code available for payment source

## 2021-12-13 DIAGNOSIS — M79661 Pain in right lower leg: Secondary | ICD-10-CM

## 2021-12-13 DIAGNOSIS — E785 Hyperlipidemia, unspecified: Secondary | ICD-10-CM | POA: Diagnosis not present

## 2021-12-13 DIAGNOSIS — Z0000 Encounter for general adult medical examination without abnormal findings: Secondary | ICD-10-CM

## 2021-12-13 LAB — COMPREHENSIVE METABOLIC PANEL
ALT: 19 U/L (ref 0–53)
AST: 22 U/L (ref 0–37)
Albumin: 4.6 g/dL (ref 3.5–5.2)
Alkaline Phosphatase: 75 U/L (ref 39–117)
BUN: 14 mg/dL (ref 6–23)
CO2: 30 mEq/L (ref 19–32)
Calcium: 9.6 mg/dL (ref 8.4–10.5)
Chloride: 103 mEq/L (ref 96–112)
Creatinine, Ser: 1.1 mg/dL (ref 0.40–1.50)
GFR: 79.53 mL/min (ref >60.00)
Glucose, Bld: 83 mg/dL (ref 70–99)
Potassium: 4.3 mEq/L (ref 3.5–5.1)
Sodium: 139 mEq/L (ref 135–145)
Total Bilirubin: 0.4 mg/dL (ref 0.2–1.2)
Total Protein: 7.2 g/dL (ref 6.0–8.3)

## 2021-12-13 LAB — CBC WITH DIFFERENTIAL/PLATELET
Basophils Absolute: 0.1 10*3/uL (ref 0.0–0.1)
Basophils Relative: 1.1 % (ref 0.0–3.0)
Eosinophils Absolute: 0.3 10*3/uL (ref 0.0–0.7)
Eosinophils Relative: 4.4 % (ref 0.0–5.0)
HCT: 42.8 % (ref 39.0–52.0)
Hemoglobin: 14.4 g/dL (ref 13.0–17.0)
Lymphocytes Relative: 33.3 % (ref 12.0–46.0)
Lymphs Abs: 2.3 10*3/uL (ref 0.7–4.0)
MCHC: 33.5 g/dL (ref 30.0–36.0)
MCV: 89.7 fl (ref 78.0–100.0)
Monocytes Absolute: 0.6 10*3/uL (ref 0.1–1.0)
Monocytes Relative: 8.5 % (ref 3.0–12.0)
Neutro Abs: 3.6 10*3/uL (ref 1.4–7.7)
Neutrophils Relative %: 52.7 % (ref 43.0–77.0)
Platelets: 289 10*3/uL (ref 150.0–400.0)
RBC: 4.78 Mil/uL (ref 4.22–5.81)
RDW: 12.9 % (ref 11.5–15.5)
WBC: 6.8 10*3/uL (ref 4.0–10.5)

## 2021-12-13 LAB — LDL CHOLESTEROL, DIRECT: Direct LDL: 152 mg/dL

## 2021-12-13 LAB — LIPID PANEL
Cholesterol: 278 mg/dL — ABNORMAL HIGH (ref 0–200)
HDL: 61.5 mg/dL (ref >39.00)
NonHDL: 216.47
Total CHOL/HDL Ratio: 5
Triglycerides: 333 mg/dL — ABNORMAL HIGH (ref 0.0–149.0)
VLDL: 66.6 mg/dL — ABNORMAL HIGH (ref 0.0–40.0)

## 2021-12-14 LAB — D-DIMER, QUANTITATIVE: D-Dimer, Quant: 0.21 mcg/mL FEU (ref <0.50)

## 2022-01-02 ENCOUNTER — Ambulatory Visit: Payer: Self-pay

## 2022-01-02 ENCOUNTER — Encounter: Payer: Self-pay | Admitting: Family Medicine

## 2022-01-02 ENCOUNTER — Ambulatory Visit (INDEPENDENT_AMBULATORY_CARE_PROVIDER_SITE_OTHER): Payer: No Typology Code available for payment source

## 2022-01-02 ENCOUNTER — Ambulatory Visit (INDEPENDENT_AMBULATORY_CARE_PROVIDER_SITE_OTHER): Payer: No Typology Code available for payment source | Admitting: Family Medicine

## 2022-01-02 VITALS — BP 140/88 | HR 79 | Ht 71.0 in | Wt 193.6 lb

## 2022-01-02 DIAGNOSIS — M79661 Pain in right lower leg: Secondary | ICD-10-CM

## 2022-01-02 MED ORDER — GABAPENTIN 300 MG PO CAPS
300.0000 mg | ORAL_CAPSULE | Freq: Three times a day (TID) | ORAL | 2 refills | Status: DC
  Filled 2022-02-28: qty 90, 30d supply, fill #0

## 2022-01-02 NOTE — Progress Notes (Signed)
? ?I, Christoper Fabian, LAT, ATC, am serving as scribe for Dr. Clementeen Graham. ? ?Subjective:   ? ?CC: R calf pain ? ?HPI: Pt is a 51 y/o male c/o R calf pain x approximately 1.5 months intermittently. Pt describes pain as a "dull ache" deep within the calf. Pt has a hx of PE/DVT. Pt locates pain to his R lateral calf/lateral lower leg .  He works as a Licensed conveyancer. ? ?R LE swelling: no ?Numbness/tingling: yes in his R ankle but due to prior R ankle surgery ?Aggravates: nothing in particular ?Treatments tried: Advil; Tylenol;  ? ?Dx testing: 12/13/21 D-dimer (negative) ? ?Pertinent review of Systems: No fevers or chills ? ?Relevant historical information: History of a pulmonary embolism ? ? ?Objective:   ? ?Vitals:  ? 01/02/22 1358  ?BP: 140/88  ?Pulse: 79  ?SpO2: 96%  ? ?General: Well Developed, well nourished, and in no acute distress.  ? ?MSK: Right calf normal-appearing no swelling.  Nontender to palpation.  Normal Motion and strength. ? ?L-spine: Nontender midline. ?Normal lumbar motion. ?Lower extremity strength is intact. ?Negative slump test and straight leg raise test. ?Reflexes and sensation are intact distally. ? ?Lab and Radiology Results ? ?Diagnostic Limited MSK Ultrasound of: Right calf ?Normal-appearing calf muscle and muscle tendinous junction area of pain at lateral calf ?Impression: Normal-appearing calf and area of pain at muscle tendinous junction right lateral gastrocnemius ? ? ?X-ray images tib-fib right lower leg obtained today personally and independently interpreted ?No acute fractures.  No significant degenerative changes are visible. ?Await formal radiology review ? ?Impression and Recommendations:   ? ?Assessment and Plan: ?51 y.o. male with right lateral calf pain at or near the muscle tendinous junction of the lateral gastrocnemius head. ?Pain is likely to either be a gastrocnemius strain or S1 radiculopathy.  He cannot recall a specific incident so S1  radiculopathy is most likely explanation at this time.  His symptoms are so mild or minor that extensive obnoxious treatment or work-up at this time is probably not worth it.  Plan for a little bit of further work-up including x-ray today and physical exam.  Treatment with home exercise program working on eccentric muscle strengthening calf muscles as well as prescribing gabapentin at bedtime for what I think is lumbosacral radiculopathy at S1.  ? ?Recheck as needed ? ?PDMP not reviewed this encounter. ?Orders Placed This Encounter  ?Procedures  ? [place] LIMITED JOINT SPACE STRUCTURES LOW RIGHT(NO LINKED CHARGES)  ?  Order Specific Question:   Reason for Exam (SYMPTOM  OR DIAGNOSIS REQUIRED)  ?  Answer:   R calf pain  ?  Order Specific Question:   Preferred imaging location?  ?  Answer:   [facility]  ? DG Tibia/Fibula Right  ?  Standing Status:   Future  ?  Number of Occurrences:   1  ?  Standing Expiration Date:   02/01/2022  ?  Order Specific Question:   Reason for Exam (SYMPTOM  OR DIAGNOSIS REQUIRED)  ?  Answer:   R calf pain  ?  Order Specific Question:   Preferred imaging location?  ?  Answer:   [facility]  ? ?Meds ordered this encounter  ?Medications  ? gabapentin (NEURONTIN) 300 MG capsule  ?  Sig: Take 1 capsule (300 mg total) by mouth 3 (three) times daily.  ?  Dispense:  90 capsule  ?  Refill:  2  ? ? ?Discussed warning signs or symptoms.  Please see discharge instructions. Patient expresses understanding. ? ? ?The above documentation has been reviewed and is accurate and complete Clementeen Graham, M.D. ? ?

## 2022-01-02 NOTE — Patient Instructions (Addendum)
Nice to meet you today. ? ?Gabapentin 300 tid ? ?Alfredson's: Stand on the edge of a step and raise onto the balls of your feet and then slowly lower back down to a dropped heel position, taking between 5-7 sec to lower back down.  Do 30 reps/day and increase to 45 reps as able. ? ?Please get an Xray today before you leave. ? ?Follow-up: as needed  ?

## 2022-01-03 NOTE — Progress Notes (Signed)
X-ray tib-fib (lower leg) shows evidence of prior fracture in the fibula but otherwise looks okay.

## 2022-02-28 ENCOUNTER — Other Ambulatory Visit (HOSPITAL_COMMUNITY): Payer: Self-pay

## 2022-02-28 MED ORDER — GABAPENTIN 300 MG PO CAPS
300.0000 mg | ORAL_CAPSULE | Freq: Three times a day (TID) | ORAL | 1 refills | Status: DC
  Filled 2022-02-28: qty 90, 30d supply, fill #0

## 2022-03-01 ENCOUNTER — Other Ambulatory Visit (HOSPITAL_COMMUNITY): Payer: Self-pay

## 2022-03-20 NOTE — Progress Notes (Signed)
[facility] [address] [place] [address] Phone: [phone] Subjective:   [name], am serving as a scribe for Dr. Antoine Primas.  I'm seeing this patient by the request  of:  Shelva Majestic, MD  CC: knee pain   [id number]  Nathaniel Manning is a 51 y.o. male coming in with complaint of knee pain. Last seen by Dr. Denyse Amass for calf pain in April. Patient states right knee pain about 2 days ago heard loud pop. Have felt clicking and popping before the pop. Currently on gabapentin for right calf pain. Can feel the pain on posterior side of knee and in the joint space. A little instability. Has been using ice and gotten in hot tub. Hx of pulmonary embolism.      Past Medical History:  Diagnosis Date   Pulmonary embolism (HCC) 2008   After broken ankle/DVT that went to lung. Coumadin/lovenox 3-4 months.    Past Surgical History:  Procedure Laterality Date   ANKLE FRACTURE SURGERY     Dr. Luiz Blare   LUMBAR SPINE SURGERY     Social History   Socioeconomic History   Marital status: Single    Spouse name: Not on file   Number of children: Not on file   Years of education: Not on file   Highest education level: Not on file  Occupational History   Not on file  Tobacco Use   Smoking status: Never   Smokeless tobacco: Never  Vaping Use   Vaping Use: Never used  Substance and Sexual Activity   Alcohol use: Yes    Alcohol/week: 6.0 standard drinks of alcohol    Types: 6 Standard drinks or equivalent per week    Comment: every other day   Drug use: No   Sexual activity: Not on file  Other Topics Concern   Not on file  Social History Narrative   Married. Son Nathaniel Manning 79 years old 11/2020. Wife OR note float pool.        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 [employer]: active enjoys outside, used to race motorcycles, travel, enjoys cooking/foodie.    Social Determinants of Health   Financial Resource Strain:  Not on file  Food Insecurity: Not on file  Transportation Needs: Not on file  Physical Activity: Not on file  Stress: Not on file  Social Connections: Not on file   No Known Allergies Family History  Problem Relation Age of Onset   Other Mother        sinus cavity cancer- presented with eye issues. 6 months chemo/radiation- lost eye   Alcohol abuse Father        estranged   Colon cancer Father        was told this by dad's GF- died 35-70.    Breast cancer Other        gma- 89,lived to 101   Breast cancer Paternal Grandmother        Current Outpatient Medications (Analgesics):    meloxicam (MOBIC) 15 MG tablet, Take 1 tablet (15 mg total) by mouth daily.   acetaminophen (TYLENOL) 325 MG tablet, Take 650 mg by mouth in the morning.   ibuprofen (ADVIL) 200 MG tablet, Take 200 mg by mouth every 6 (six) hours as needed.   Current Outpatient Medications (Other):    gabapentin (NEURONTIN) 300 MG capsule, Take 1 capsule (300 mg total) by mouth 3 (three) times daily.  Reviewed prior external information including notes and imaging from  primary care provider As well as notes that were available from care everywhere and other healthcare systems.  Past medical history, social, surgical and family history all reviewed in electronic medical record.  No pertanent information unless stated regarding to the chief complaint.   Review of Systems:  No headache, visual changes, nausea, vomiting, diarrhea, constipation, dizziness, abdominal pain, skin rash, fevers, chills, night sweats, weight loss, swollen lymph nodes, body aches, joint swelling, chest pain, shortness of breath, mood changes. POSITIVE muscle aches  Objective  Blood pressure (!) 140/98, pulse 76, height 5\' 11"  (1.803 m), weight 197 lb (89.4 kg), SpO2 96 %.   General: No apparent distress alert and oriented x3 mood and affect normal, dressed appropriately.  HEENT: Pupils equal, extraocular movements intact  Respiratory:  Patient's speak in full sentences and does not appear short of breath  Cardiovascular: No lower extremity edema, non tender, no erythema  Right knee exam does have a trace effusion noted.  Patient does have tenderness to palpation over the medial joint line.  Patient does have more movement when stressing the MCL but does have a endpoint.  Positive McMurray's and tenderness over the medial joint line no calf pain negative anterior drawer  Limited muscular skeletal ultrasound was performed and interpreted by , M  Limited ultrasound shows some hypoechoic changes of the patellofemoral joint.  The patient does have some mild narrowing of the patellofemoral joint.  Medial meniscus does have what appears to be an acute on chronic tear noted with 25% displacement noted of the mid substance.  Patient also has some hypoechoic changes in the proximal deep fibers of the MCL. 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

## 2022-03-21 ENCOUNTER — Ambulatory Visit (INDEPENDENT_AMBULATORY_CARE_PROVIDER_SITE_OTHER): Payer: No Typology Code available for payment source | Admitting: Family Medicine

## 2022-03-21 ENCOUNTER — Ambulatory Visit: Payer: Self-pay

## 2022-03-21 ENCOUNTER — Encounter: Payer: Self-pay | Admitting: Family Medicine

## 2022-03-21 ENCOUNTER — Ambulatory Visit (INDEPENDENT_AMBULATORY_CARE_PROVIDER_SITE_OTHER): Payer: No Typology Code available for payment source

## 2022-03-21 VITALS — BP 140/98 | HR 76 | Ht 71.0 in | Wt 197.0 lb

## 2022-03-21 DIAGNOSIS — M25561 Pain in right knee: Secondary | ICD-10-CM

## 2022-03-21 DIAGNOSIS — S83241A Other tear of medial meniscus, current injury, right knee, initial encounter: Secondary | ICD-10-CM | POA: Insufficient documentation

## 2022-03-21 MED ORDER — MELOXICAM 15 MG PO TABS: 15.0000 mg | ORAL_TABLET | Freq: Every day | ORAL | 0 refills | Status: DC

## 2022-03-21 NOTE — Assessment & Plan Note (Signed)
Patient does not appear to be in acute mental illness continue with the partial MCL noted as well potentially.  We discussed with patient about a hinged brace, home exercises, icing regimen.  X-rays ordered today to make sure there is no avulsion that could be contributing to the pop the patient did here.  I do not feel that patient has any significant instability of the ACL noted today.  You there and has more pain with twisting motions than anything else.  Patient will try the conservative therapy first and then follow-up with me again in 4 to 8 weeks for further evaluation and treatment.

## 2022-03-21 NOTE — Patient Instructions (Signed)
Brace when working Xray today Meloxicam 15mg  daily for 10 days then as needed Ice 20 mins every 4 hours when possible See you again in 4-5 weeks (okay to double)

## 2022-05-02 ENCOUNTER — Ambulatory Visit: Payer: No Typology Code available for payment source | Admitting: Family Medicine

## 2022-06-05 ENCOUNTER — Ambulatory Visit: Payer: No Typology Code available for payment source | Admitting: Family Medicine

## 2022-08-22 ENCOUNTER — Encounter: Payer: Self-pay | Admitting: Family

## 2022-08-22 ENCOUNTER — Ambulatory Visit (INDEPENDENT_AMBULATORY_CARE_PROVIDER_SITE_OTHER): Payer: No Typology Code available for payment source | Admitting: Family

## 2022-08-22 VITALS — BP 129/84 | HR 93 | Temp 97.5°F | Ht 71.0 in | Wt 197.6 lb

## 2022-08-22 DIAGNOSIS — J029 Acute pharyngitis, unspecified: Secondary | ICD-10-CM

## 2022-08-22 DIAGNOSIS — J069 Acute upper respiratory infection, unspecified: Secondary | ICD-10-CM

## 2022-08-22 LAB — POC COVID19 BINAXNOW: SARS Coronavirus 2 Ag: NEGATIVE

## 2022-08-22 LAB — POCT RAPID STREP A (OFFICE): Rapid Strep A Screen: NEGATIVE

## 2022-08-22 MED ORDER — METHYLPREDNISOLONE ACETATE 80 MG/ML IJ SUSP
80.0000 mg | Freq: Once | INTRAMUSCULAR | Status: AC
  Administered 2022-08-22: 80 mg via INTRAMUSCULAR

## 2022-08-22 NOTE — Progress Notes (Signed)
Patient ID: Nathaniel Manning, male    DOB: 05/26/1971, 51 y.o.   MRN: [id number]  Chief Complaint  Patient presents with   Cough    Pt c/o Cough with clear mucus, Fever of 101.0, Sore throat, headache for about 5 days. Has tried tylenol and Advil which did help.     HPI:      Persistent cough:  Pt c/o Cough with clear mucus, & fatigue for about a week, had Fever of 101.0, Sore throat, headache for about 5 days. Denies sinus sx.  Has tried tylenol and Advil which did help.       Assessment & Plan:  1. Sore throat - rapid strep neg - pt son had strep, pt has not had since tonsils removed. advised on continuing Advil, up to 600mg  tid prn for pain,and fever. Gargle with warm salt water several times per day. OK to use over the counter Chloraseptic spray and/or throat lozenges as needed. Drink plenty of water.  - POCT rapid strep A  2. Viral upper respiratory tract infection - rapid covid neg - pt reports fatigue & cough is affecting his work, given steroid injection in office and advised on SE, continue Advil tid prn, increase fluid intake.  - methylPREDNISolone acetate (DEPO-MEDROL) injection 80 mg - POC COVID-19  Subjective:    Outpatient Medications Prior to Visit  Medication Sig Dispense Refill   acetaminophen (TYLENOL) 325 MG tablet Take 650 mg by mouth in the morning.     gabapentin (NEURONTIN) 300 MG capsule Take 1 capsule (300 mg total) by mouth 3 (three) times daily. 90 capsule 1   ibuprofen (ADVIL) 200 MG tablet Take 200 mg by mouth every 6 (six) hours as needed.     meloxicam (MOBIC) 15 MG tablet Take 1 tablet (15 mg total) by mouth daily. 30 tablet 0   No facility-administered medications prior to visit.   Past Medical History:  Diagnosis Date   Pulmonary embolism (HCC) 2008   After broken ankle/DVT that went to lung. Coumadin/lovenox 3-4 months.    Past Surgical History:  Procedure Laterality Date   ANKLE FRACTURE SURGERY     Dr. 2009   LUMBAR SPINE  SURGERY     No Known Allergies    Objective:    Physical Exam Vitals and nursing note reviewed.  Constitutional:      General: He is not in acute distress.    Appearance: Normal appearance.  HENT:     Head: Normocephalic.     Right Ear: Tympanic membrane and ear canal normal.     Left Ear: Tympanic membrane and ear canal normal.     Nose:     Right Sinus: No maxillary sinus tenderness or frontal sinus tenderness.     Left Sinus: No maxillary sinus tenderness or frontal sinus tenderness.     Mouth/Throat:     Mouth: Mucous membranes are moist.     Pharynx: Posterior oropharyngeal erythema present. No pharyngeal swelling, oropharyngeal exudate or uvula swelling.     Tonsils: No tonsillar exudate or tonsillar abscesses. 0 on the right. 0 on the left.  Cardiovascular:     Rate and Rhythm: Normal rate and regular rhythm.  Pulmonary:     Effort: Pulmonary effort is normal.     Breath sounds: Normal breath sounds.  Musculoskeletal:        General: Normal range of motion.     Cervical back: Normal range of motion.  Lymphadenopathy:     Head:  Right side of head: No preauricular or posterior auricular adenopathy.     Left side of head: No preauricular or posterior auricular adenopathy.     Cervical: No cervical adenopathy.  Skin:    General: Skin is warm and dry.  Neurological:     Mental Status: He is alert and oriented to person, place, and time.  Psychiatric:        Mood and Affect: Mood normal.    BP 129/84 (BP Location: Left Arm, Patient Position: Sitting, Cuff Size: Large)   Pulse 93   Temp (!) 97.5 F (36.4 C) (Temporal)   Ht 5\' 11"  (1.803 m)   Wt 197 lb 9.6 oz (89.6 kg)   SpO2 94%   BMI 27.56 kg/m  Wt Readings from Last 3 Encounters:  08/22/22 197 lb 9.6 oz (89.6 kg)  03/21/22 197 lb (89.4 kg)  01/02/22 193 lb 9.6 oz (87.8 kg)       03/04/22, NP

## 2022-12-20 DIAGNOSIS — R221 Localized swelling, mass and lump, neck: Secondary | ICD-10-CM | POA: Diagnosis not present

## 2022-12-27 ENCOUNTER — Other Ambulatory Visit (HOSPITAL_BASED_OUTPATIENT_CLINIC_OR_DEPARTMENT_OTHER): Payer: Self-pay | Admitting: Otolaryngology

## 2022-12-27 DIAGNOSIS — R221 Localized swelling, mass and lump, neck: Secondary | ICD-10-CM

## 2023-01-06 ENCOUNTER — Ambulatory Visit (HOSPITAL_BASED_OUTPATIENT_CLINIC_OR_DEPARTMENT_OTHER)
Admission: RE | Admit: 2023-01-06 | Discharge: 2023-01-06 | Disposition: A | Discharge status: Home / Self Care | Payer: 59 | Source: Ambulatory Visit | Attending: Otolaryngology | Admitting: Otolaryngology

## 2023-01-06 DIAGNOSIS — R221 Localized swelling, mass and lump, neck: Secondary | ICD-10-CM | POA: Diagnosis not present

## 2023-01-06 MED ORDER — IOHEXOL 300 MG/ML  SOLN
100.0000 mL | Freq: Once | INTRAMUSCULAR | Status: AC | PRN
  Administered 2023-01-06: 75 mL via INTRAVENOUS

## 2023-02-20 ENCOUNTER — Institutional Professional Consult (permissible substitution): Payer: 59 | Admitting: Plastic Surgery

## 2023-05-24 ENCOUNTER — Encounter: Payer: Self-pay | Admitting: Family Medicine

## 2023-05-24 ENCOUNTER — Ambulatory Visit (INDEPENDENT_AMBULATORY_CARE_PROVIDER_SITE_OTHER): Payer: 59 | Admitting: Physician Assistant

## 2023-05-24 ENCOUNTER — Encounter: Payer: Self-pay | Admitting: Physician Assistant

## 2023-05-24 ENCOUNTER — Encounter: Payer: 59 | Admitting: Family Medicine

## 2023-05-24 VITALS — BP 156/90 | HR 72 | Temp 97.7°F | Resp 18 | Ht 71.0 in | Wt 194.1 lb

## 2023-05-24 DIAGNOSIS — R030 Elevated blood-pressure reading, without diagnosis of hypertension: Secondary | ICD-10-CM | POA: Diagnosis not present

## 2023-05-24 DIAGNOSIS — Z0000 Encounter for general adult medical examination without abnormal findings: Secondary | ICD-10-CM | POA: Diagnosis not present

## 2023-05-24 DIAGNOSIS — Z1283 Encounter for screening for malignant neoplasm of skin: Secondary | ICD-10-CM | POA: Diagnosis not present

## 2023-05-24 DIAGNOSIS — E785 Hyperlipidemia, unspecified: Secondary | ICD-10-CM | POA: Diagnosis not present

## 2023-05-24 NOTE — Patient Instructions (Addendum)
It was great to see you!  Please make an appointment with the lab on your way out. I would like for you to return for lab work within 1-2 weeks. After midnight on the day of the lab draw, please do not eat anything. You may have water, black coffee, unsweetened tea.  Take care,  Samantha   

## 2023-05-24 NOTE — Progress Notes (Signed)
Subjective:    Nathaniel Manning is a 52 y.o. male and is here for a comprehensive physical exam.  HPI  There are no preventive care reminders to display for this patient.  Acute Concerns: none  Chronic Issues: Elevated blood pressure reading Reports that he has white coat hypertension  Denies major concerns with chest pain, shortness of breath, lower extremity swelling Has had elevated readings in the past but only at the doctors office  Health Maintenance: Immunizations -- UpToDate  Colonoscopy -- UpToDate  PSA -- No results found for: "PSA1", "PSA" Diet -- overall well balanced, does have young children that eat some fast food at times Sleep habits -- overall good -- sometimes will stay up late Exercise -- will walk occasionally  Weight -- Weight: 194 lb 2 oz (88.1 kg)  Recent weight history Wt Readings from Last 10 Encounters:  05/24/23 194 lb 2 oz (88.1 kg)  08/22/22 197 lb 9.6 oz (89.6 kg)  03/21/22 197 lb (89.4 kg)  01/02/22 193 lb 9.6 oz (87.8 kg)  12/12/21 192 lb 9.6 oz (87.4 kg)  03/16/21 190 lb (86.2 kg)  12/08/20 198 lb 9.6 oz (90.1 kg)  09/28/20 197 lb (89.4 kg)  08/05/20 197 lb 6 oz (89.5 kg)  04/08/20 200 lb (90.7 kg)   Body mass index is 27.07 kg/m.  Mood -- no major concerns Alcohol use --  reports current alcohol use of about 6.0 standard drinks of alcohol per week. No significant.  Tobacco use --  Tobacco Use: Low Risk  (05/24/2023)   Patient History    Smoking Tobacco Use: Never    Smokeless Tobacco Use: Never    Passive Exposure: Not on file    Eligible for Low Dose CT? yes  UTD with eye doctor? yes UTD with dentist? yes     05/24/2023    3:08 PM  Depression screen PHQ 2/9  Decreased Interest 0  Down, Depressed, Hopeless 0  PHQ - 2 Score 0  Altered sleeping 0  Tired, decreased energy 0  Change in appetite 0  Feeling bad or failure about yourself  0  Trouble concentrating 0  Moving slowly or fidgety/restless 0  Suicidal  thoughts 0  PHQ-9 Score 0  Difficult doing work/chores Not difficult at all    Other providers/specialists: Patient Care Team: Shelva Majestic, MD as PCP - General (Family Medicine) Dominica Severin, MD as Consulting Physician (Orthopedic Surgery)    PMHx, SurgHx, SocialHx, Medications, and Allergies were reviewed in the Visit Navigator and updated as appropriate.   Past Medical History:  Diagnosis Date   Pulmonary embolism (HCC) 2008   After broken ankle/DVT that went to lung. Coumadin/lovenox 3-4 months.      Past Surgical History:  Procedure Laterality Date   ANKLE FRACTURE SURGERY     Dr. Luiz Blare   LUMBAR SPINE SURGERY       Family History  Problem Relation Age of Onset   Other Mother        sinus cavity cancer- presented with eye issues. 6 months chemo/radiation- lost eye   Alcohol abuse Father        estranged   Colon cancer Father        was told this by dad's GF- died 53-70.    Breast cancer Other        gma- 89,lived to 101   Breast cancer Paternal Grandmother     Social History   Tobacco Use   Smoking status: Never  Smokeless tobacco: Never  Vaping Use   Vaping status: Never Used  Substance Use Topics   Alcohol use: Yes    Alcohol/week: 6.0 standard drinks of alcohol    Types: 6 Standard drinks or equivalent per week    Comment: every other day   Drug use: No    Review of Systems:   Review of Systems  Constitutional:  Negative for chills, fever, malaise/fatigue and weight loss.  HENT:  Negative for hearing loss, sinus pain and sore throat.   Respiratory:  Negative for cough and hemoptysis.   Cardiovascular:  Negative for chest pain, palpitations, leg swelling and PND.  Gastrointestinal:  Negative for abdominal pain, constipation, diarrhea, heartburn, nausea and vomiting.  Genitourinary:  Negative for dysuria, frequency and urgency.  Musculoskeletal:  Negative for back pain, myalgias and neck pain.  Skin:  Negative for itching and rash.   Neurological:  Negative for dizziness, tingling, seizures and headaches.  Endo/Heme/Allergies:  Negative for polydipsia.  Psychiatric/Behavioral:  Negative for depression. The patient is not nervous/anxious.     Objective:    Vitals:   05/24/23 1505  BP: (!) 156/90  Pulse: 72  Resp: 18  Temp: 97.7 F (36.5 C)  SpO2: 99%    Body mass index is 27.07 kg/m.  General  Alert, cooperative, no distress, appears stated age  Head:  Normocephalic, without obvious abnormality, atraumatic  Eyes:  PERRL, conjunctiva/corneas clear, EOM's intact, fundi benign, both eyes       Ears:  Normal TM's and external ear canals, both ears  Nose: Nares normal, septum midline, mucosa normal, no drainage or sinus tenderness  Throat: Lips, mucosa, and tongue normal; teeth and gums normal  Neck: Supple, symmetrical, trachea midline, no adenopathy;     thyroid:  No enlargement/tenderness/nodules; no carotid bruit or JVD  Back:   Symmetric, no curvature, ROM normal, no CVA tenderness  Lungs:   Clear to auscultation bilaterally, respirations unlabored  Chest wall:  No tenderness or deformity  Heart:  Regular rate and rhythm, S1 and S2 normal, no murmur, rub or gallop  Abdomen:   Soft, non-tender, bowel sounds active all four quadrants, no masses, no organomegaly  Extremities: Extremities normal, atraumatic, no cyanosis or edema  Prostate : Deferred   Skin: Skin color, texture, turgor normal, no rashes or lesions  Lymph nodes: Cervical, supraclavicular, and axillary nodes normal  Neurologic: CNII-XII grossly intact. Normal strength, sensation and reflexes throughout   AssessmentPlan:   Routine physical examination Today patient counseled on age appropriate routine health concerns for screening and prevention, each reviewed and up to date or declined. Immunizations reviewed and up to date or declined. Labs ordered and reviewed. Risk factors for depression reviewed and negative. Hearing function and visual  acuity are intact. ADLs screened and addressed as needed. Functional ability and level of safety reviewed and appropriate. Education, counseling and referrals performed based on assessed risks today. Patient provided with a copy of personalized plan for preventive services.  Hyperlipidemia, unspecified hyperlipidemia type Update lipid panel when fasting  Skin cancer screening Referral to dermatology   Elevated blood pressure reading Above goal today No evidence of end-organ damage on my exam Recommend patient monitor home blood pressure at least a few times weekly If home monitoring shows consistent elevation, or any symptom(s) develop, recommend reach out to [place] for further advice on next steps   Jarold Motto, PA-C [facility]

## 2023-06-04 ENCOUNTER — Other Ambulatory Visit: Payer: 59

## 2023-06-06 DIAGNOSIS — H524 Presbyopia: Secondary | ICD-10-CM | POA: Diagnosis not present

## 2023-08-15 ENCOUNTER — Ambulatory Visit (INDEPENDENT_AMBULATORY_CARE_PROVIDER_SITE_OTHER): Payer: 59 | Admitting: Family

## 2023-08-15 ENCOUNTER — Encounter: Payer: Self-pay | Admitting: Family

## 2023-08-15 VITALS — BP 125/84 | HR 74 | Temp 97.5°F | Ht 71.0 in | Wt 196.0 lb

## 2023-08-15 DIAGNOSIS — J069 Acute upper respiratory infection, unspecified: Secondary | ICD-10-CM | POA: Diagnosis not present

## 2023-08-15 MED ORDER — GUAIFENESIN-CODEINE 100-10 MG/5ML PO SOLN: 5.0000 mL | Freq: Three times a day (TID) | ORAL | 0 refills | Status: DC | PRN

## 2023-08-15 MED ORDER — PSEUDOEPHEDRINE-CODEINE-GG 30-10-100 MG/5ML PO SOLN: 10.0000 mL | Freq: Three times a day (TID) | ORAL | 0 refills | Status: DC | PRN

## 2023-08-15 MED ORDER — METHYLPREDNISOLONE ACETATE 80 MG/ML IJ SUSP
80.0000 mg | Freq: Once | INTRAMUSCULAR | Status: AC
  Administered 2023-08-15: 80 mg via INTRAMUSCULAR

## 2023-08-15 NOTE — Addendum Note (Signed)
Addended byDulce Sellar on: 08/15/2023 09:20 AM   Modules accepted: Orders

## 2023-08-15 NOTE — Progress Notes (Signed)
Patient ID: Nathaniel Manning, male    DOB: [birth year], 52 y.o.   MRN: [id number]  Chief Complaint  Patient presents with   Cough    Pt c/o Cough, wheezing and SOB, Present since Saturday and has been worsening. Has tried robitussin and advil which did not help sx.    History of Present Illness   The patient, a Engineer, maintenance, presents with a persistent cough for 6 days. The cough is described as having a high pitch at the end, which is a new symptom for the patient. Accompanying the cough is a constant headache, which the patient attributes to the coughing. The patient reports feeling unwell and lacking energy, with the cough worsening upon exertion. The patient also reports a history of exposure to drywall dust from a recent construction project, but the cough did not start immediately after this exposure. The patient has been self-medicating with over-the-counter remedies, but these have not been effective. The patient denies having a fever, but reports a sore throat and postnasal drainage. The patient denies any history of asthma.    Assessment & Plan:     Upper Respiratory Infection - Persistent cough for almost a week, associated with fatigue and headache. No fever or sinus pressure. No history of asthma. No signs of infection on physical examination. -Administer DepoMedrol 80mg  shot today to reduce inflammation and improve symptoms. -Advise use of nasal saline spray several times a day to disinfect. -Recommend generic Sudafed to help with congestion. -Advise to sleep propped up to help with drainage. -Advise to avoid Advil today due to prednisone shot, but can use Tylenol. -Provide Robitussin with codeine for nighttime cough relief, advised not to drive while taking this medication. -Advised to increase fluid intake. -Advised to monitor symptoms and return if they worsen or do not improve by next week.    Subjective:    Outpatient Medications Prior to  Visit  Medication Sig Dispense Refill   ibuprofen (ADVIL) 200 MG tablet Take 200 mg by mouth every 6 (six) hours as needed.     Multiple Vitamin (MULTI-DAY VITAMINS PO) Take by mouth.     No facility-administered medications prior to visit.   Past Medical History:  Diagnosis Date   Pulmonary embolism (HCC) 2008   After broken ankle/DVT that went to lung. Coumadin/lovenox 3-4 months.    Past Surgical History:  Procedure Laterality Date   ANKLE FRACTURE SURGERY     Dr. Luiz Blare   LUMBAR SPINE SURGERY     No Known Allergies    Objective:    Physical Exam Vitals and nursing note reviewed.  Constitutional:      General: He is not in acute distress.    Appearance: Normal appearance.  HENT:     Head: Normocephalic.     Right Ear: Tympanic membrane and ear canal normal.     Left Ear: Tympanic membrane and ear canal normal.     Nose:     Right Sinus: Frontal sinus tenderness present. No maxillary sinus tenderness.     Left Sinus: Frontal sinus tenderness present. No maxillary sinus tenderness.     Mouth/Throat:     Mouth: Mucous membranes are moist.     Pharynx: No pharyngeal swelling, oropharyngeal exudate, posterior oropharyngeal erythema or uvula swelling.     Tonsils: No tonsillar exudate or tonsillar abscesses.  Cardiovascular:     Rate and Rhythm: Normal rate and regular rhythm.  Pulmonary:     Effort: Pulmonary effort is normal.  Breath sounds: Normal breath sounds.  Musculoskeletal:        General: Normal range of motion.     Cervical back: Normal range of motion.  Lymphadenopathy:     Head:     Right side of head: No preauricular or posterior auricular adenopathy.     Left side of head: No preauricular or posterior auricular adenopathy.     Cervical: No cervical adenopathy.  Skin:    General: Skin is warm and dry.  Neurological:     Mental Status: He is alert and oriented to person, place, and time.  Psychiatric:        Mood and Affect: Mood normal.    BP  125/84 (BP Location: Left Arm, Patient Position: Sitting, Cuff Size: Large)   Pulse 74   Temp (!) 97.5 F (36.4 C) (Temporal)   Ht 5\' 11"  (1.803 m)   Wt 196 lb (88.9 kg)   SpO2 97%   BMI 27.34 kg/m  Wt Readings from Last 3 Encounters:  08/15/23 196 lb (88.9 kg)  05/24/23 194 lb 2 oz (88.1 kg)  08/22/22 197 lb 9.6 oz (89.6 kg)       Dulce Sellar, NP

## 2023-10-25 ENCOUNTER — Encounter: Payer: 59 | Admitting: Family Medicine

## 2023-12-31 ENCOUNTER — Ambulatory Visit (INDEPENDENT_AMBULATORY_CARE_PROVIDER_SITE_OTHER): Admitting: Podiatry

## 2023-12-31 ENCOUNTER — Ambulatory Visit (INDEPENDENT_AMBULATORY_CARE_PROVIDER_SITE_OTHER)

## 2023-12-31 ENCOUNTER — Other Ambulatory Visit: Payer: Self-pay | Admitting: Podiatry

## 2023-12-31 ENCOUNTER — Encounter: Payer: Self-pay | Admitting: Podiatry

## 2023-12-31 DIAGNOSIS — M109 Gout, unspecified: Secondary | ICD-10-CM

## 2023-12-31 DIAGNOSIS — M10071 Idiopathic gout, right ankle and foot: Secondary | ICD-10-CM | POA: Diagnosis not present

## 2024-01-01 ENCOUNTER — Encounter: Payer: Self-pay | Admitting: Podiatry

## 2024-01-01 LAB — BASIC METABOLIC PANEL WITH GFR
BUN/Creatinine Ratio: 21 — ABNORMAL HIGH (ref 9–20)
BUN: 14 mg/dL (ref 6–24)
CO2: 23 mmol/L (ref 20–29)
Calcium: 9.8 mg/dL (ref 8.7–10.2)
Chloride: 101 mmol/L (ref 96–106)
Creatinine, Ser: 0.68 mg/dL — ABNORMAL LOW (ref 0.76–1.27)
Glucose: 97 mg/dL (ref 70–99)
Potassium: 5 mmol/L (ref 3.5–5.2)
Sodium: 140 mmol/L (ref 134–144)
eGFR: 113 mL/min/{1.73_m2} (ref >59)

## 2024-01-01 LAB — CBC WITH DIFFERENTIAL/PLATELET
Basophils Absolute: 0.1 10*3/uL (ref 0.0–0.2)
Basos: 1 %
EOS (ABSOLUTE): 0.3 10*3/uL (ref 0.0–0.4)
Eos: 3 %
Hematocrit: 45.5 % (ref 37.5–51.0)
Hemoglobin: 15.3 g/dL (ref 13.0–17.7)
Immature Grans (Abs): 0 10*3/uL (ref 0.0–0.1)
Immature Granulocytes: 0 %
Lymphocytes Absolute: 1.4 10*3/uL (ref 0.7–3.1)
Lymphs: 14 %
MCH: 30.8 pg (ref 26.6–33.0)
MCHC: 33.6 g/dL (ref 31.5–35.7)
MCV: 92 fL (ref 79–97)
Monocytes Absolute: 0.7 10*3/uL (ref 0.1–0.9)
Monocytes: 6 %
Neutrophils Absolute: 8 10*3/uL — ABNORMAL HIGH (ref 1.4–7.0)
Neutrophils: 76 %
Platelets: 257 10*3/uL (ref 150–450)
RBC: 4.96 x10E6/uL (ref 4.14–5.80)
RDW: 12.6 % (ref 11.6–15.4)
WBC: 10.4 10*3/uL (ref 3.4–10.8)

## 2024-01-01 LAB — URIC ACID: Uric Acid: 8.2 mg/dL (ref 3.8–8.4)

## 2024-01-01 LAB — SEDIMENTATION RATE: Sed Rate: 3 mm/h (ref 0–30)

## 2024-01-01 MED ORDER — COLCHICINE 0.6 MG PO TABS: ORAL_TABLET | ORAL | 2 refills | Status: DC

## 2024-01-01 NOTE — Addendum Note (Signed)
 Addended byLilian Kapur, Austyn Perriello R on: 01/01/2024 12:06 PM   Modules accepted: Orders

## 2024-01-05 ENCOUNTER — Encounter: Payer: Self-pay | Admitting: Podiatry

## 2024-01-05 NOTE — Progress Notes (Signed)
  Subjective:  Patient ID: Nathaniel Manning, male    DOB: [birth year],  MRN: [id number]  Chief Complaint  Patient presents with   Foot Pain    Patient is here for for possible gout attack Knuckle is red but also looks like possible ingrown nail Right hallux    53 y.o. male presents with the above complaint. History confirmed with patient.  Developed over the weekend, referred to me by his wife who is a patient of mine and is an Charity fundraiser, he has no known history of gout previously but this is what she suspected.  No bug bites trauma or ulcerations that he knows of  Objective:  Physical Exam: warm, good capillary refill, no trophic changes or ulcerative lesions, normal DP and PT pulses, normal sensory exam, and erythematous painful swollen right first MTP joint   Radiographs: Multiple views x-ray of the right foot: no fracture, dislocation, swelling or degenerative changes noted Assessment:   1. Acute gout of right foot, unspecified cause      Plan:  Patient was evaluated and treated and all questions answered.   We discussed etiology and treatment options of acute gout as well as possible differential of pseudogout, do not suspect septic arthritis with history and medical history.  X-ray reviewed with patient.  Previously sent him for lab work that he completed today, this returned the day after his visit and uric acid was elevated.  Corticosteroid injection was recommended at the visit and following sterile prep with Betadine and consent the first MTPJ of the right foot was injected with 20 mg Kenalog and 5 mg of Marcaine.  He tolerated it well and was dressed with a Band-Aid.  I also placed him on a course of colchicine.  Discussed risk of recurrence as well as a low purine diet.  Follow-up with me as needed.  Return if symptoms worsen or fail to improve.

## 2024-02-03 ENCOUNTER — Encounter: Payer: Self-pay | Admitting: Dermatology

## 2024-02-03 ENCOUNTER — Ambulatory Visit: Admitting: Dermatology

## 2024-02-03 VITALS — BP 107/69 | HR 77

## 2024-02-03 DIAGNOSIS — Z808 Family history of malignant neoplasm of other organs or systems: Secondary | ICD-10-CM | POA: Diagnosis not present

## 2024-02-03 DIAGNOSIS — D1801 Hemangioma of skin and subcutaneous tissue: Secondary | ICD-10-CM | POA: Diagnosis not present

## 2024-02-03 DIAGNOSIS — L578 Other skin changes due to chronic exposure to nonionizing radiation: Secondary | ICD-10-CM | POA: Diagnosis not present

## 2024-02-03 DIAGNOSIS — L821 Other seborrheic keratosis: Secondary | ICD-10-CM | POA: Diagnosis not present

## 2024-02-03 DIAGNOSIS — Z1283 Encounter for screening for malignant neoplasm of skin: Secondary | ICD-10-CM

## 2024-02-03 DIAGNOSIS — L731 Pseudofolliculitis barbae: Secondary | ICD-10-CM | POA: Diagnosis not present

## 2024-02-03 DIAGNOSIS — W908XXA Exposure to other nonionizing radiation, initial encounter: Secondary | ICD-10-CM | POA: Diagnosis not present

## 2024-02-03 DIAGNOSIS — D229 Melanocytic nevi, unspecified: Secondary | ICD-10-CM

## 2024-02-03 DIAGNOSIS — L814 Other melanin hyperpigmentation: Secondary | ICD-10-CM | POA: Diagnosis not present

## 2024-02-03 MED ORDER — CLINDAMYCIN PHOSPHATE 1 % EX LOTN
TOPICAL_LOTION | Freq: Every day | CUTANEOUS | 2 refills | Status: AC
Start: 2024-02-03 — End: 2025-02-02
  Filled 2024-06-17: qty 60, 30d supply, fill #0

## 2024-02-03 MED ORDER — DOXYCYCLINE HYCLATE 50 MG PO CAPS
50.0000 mg | ORAL_CAPSULE | Freq: Every day | ORAL | 2 refills | Status: AC
Start: 2024-02-03 — End: ?
  Filled 2024-06-17: qty 90, 90d supply, fill #0

## 2024-02-03 NOTE — Patient Instructions (Signed)

## 2024-02-03 NOTE — Progress Notes (Signed)
   New Patient Visit   Subjective  Nathaniel Manning is a 53 y.o. male who presents for the following: Skin Cancer Screening and Upper Body Skin Exam. No hx of skin  caner. Family hx skin cancer.  The patient presents for Upper Body Skin Exam (UBSE) for skin cancer screening and mole check. The patient has spots, moles and lesions to be evaluated, some may be new or changing.  The following portions of the chart were reviewed this encounter and updated as appropriate: medications, allergies, medical history  Review of Systems:  No other skin or systemic complaints except as noted in HPI or Assessment and Plan.  Objective  Well appearing patient in no apparent distress; mood and affect are within normal limits.  All skin waist up examined. Relevant physical exam findings are noted in the Assessment and Plan.    Assessment & Plan   Skin cancer screening performed today.  Actinic Damage - Chronic condition, secondary to cumulative UV/sun exposure - diffuse scaly erythematous macules with underlying dyspigmentation - Recommend daily broad spectrum sunscreen SPF 30+ to sun-exposed areas, reapply every 2 hours as needed.  - Staying in the shade or wearing long sleeves, sun glasses (UVA+UVB protection) and wide brim hats (4-inch brim around the entire circumference of the hat) are also recommended for sun protection.  - Call for new or changing lesions.  Lentigines, Seborrheic Keratoses, Hemangiomas - Benign normal skin lesions - Benign-appearing - Call for any changes  Melanocytic Nevi - Tan-brown and/or pink-flesh-colored symmetric macules and papules - Benign appearing on exam today - Observation - Call clinic for new or changing moles - Recommend daily use of broad spectrum spf 30+ sunscreen to sun-exposed areas.   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 The condition presents as papules and pustules in the beard area, often accompanied by post-inflammatory hyperpigmentation and occasional scarring. Contributing factors such as shaving technique and frequency were discussed, and the patient was advised to consider modifications including using electric clippers instead of razors, shaving less frequently, shaving in the direction of hair growth, and avoiding skin stretching during shaving. Topical treatments such as benzoyl peroxide, topical antibiotics, may be used to reduce inflammation and prevent recurrence. The chronic nature of the condition and the importance of consistent care and grooming modifications were emphasized, and follow-up was recommended to assess response to treatment and adjust as needed. - clindamycin (CLEOCIN-T) 1 % lotion; Apply topically daily. Apply to active red spots or flairs  Dispense: 60 mL; Refill: 2 - doxycycline (VIBRAMYCIN) 50 MG capsule; Take 1 capsule (50 mg total) by mouth daily.  Dispense: 90 capsule; Refill: 2   Return in about 3 months (around 05/05/2024) for follow up.  I, Haig Levan, Surg Tech III, am acting as scribe for Deneise Finlay, MD.   Documentation: I have reviewed the above documentation for accuracy and completeness, and I agree with the above.  Deneise Finlay, MD

## 2024-03-28 IMAGING — DX DG TIBIA/FIBULA 2V*R*
2 series · 2 of 2 positions shown · non-contrast
Comparison: None.

CLINICAL DATA: Chronic right calf pain for 1-2 months. Technologist
note reports patient states history of tibial fracture.

EXAM:
RIGHT TIBIA AND FIBULA - 2 VIEW

[tibia ap]
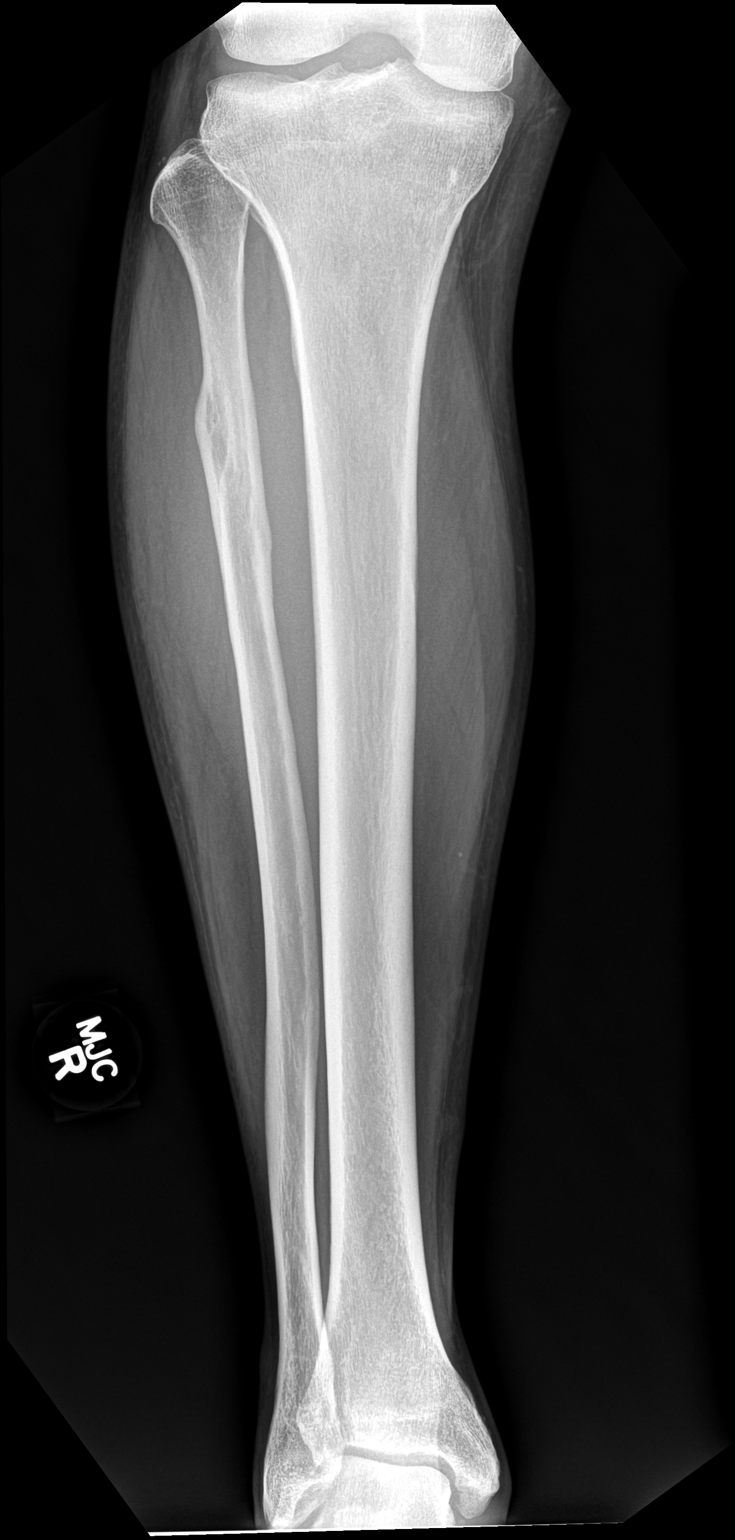

[tibia lat]
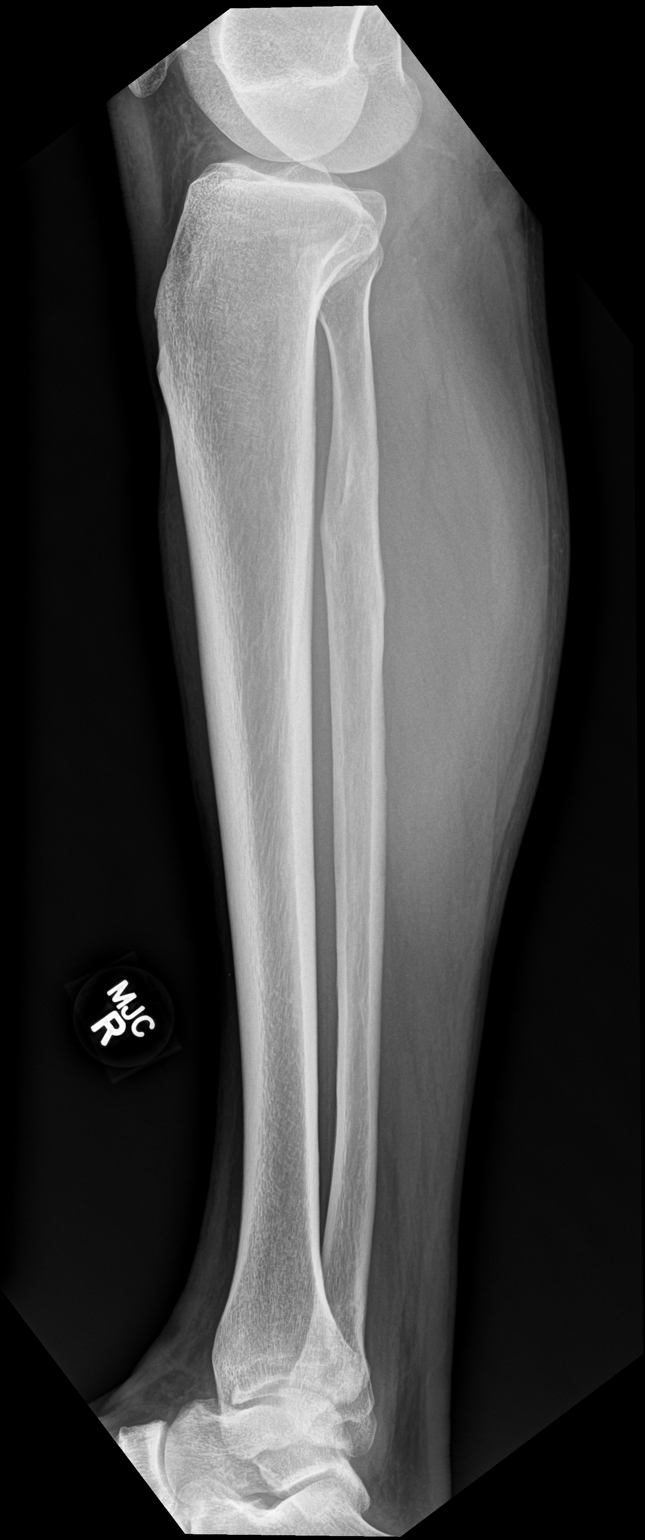

[2 of 2 positions shown; findings below may reference images not displayed]

FINDINGS: Remodeling of the proximal shaft of the fibula reflecting prior
injury. No acute fracture or malalignment. No intrinsic osseous
lesion. Normal mineralization.
IMPRESSION: No acute abnormality.  Evidence of prior fibular fracture.

## 2024-04-27 ENCOUNTER — Encounter: Payer: Self-pay | Admitting: Family Medicine

## 2024-04-27 ENCOUNTER — Ambulatory Visit (INDEPENDENT_AMBULATORY_CARE_PROVIDER_SITE_OTHER): Payer: 59 | Admitting: Family Medicine

## 2024-04-27 ENCOUNTER — Ambulatory Visit: Payer: Self-pay | Admitting: Family Medicine

## 2024-04-27 VITALS — BP 100/64 | HR 61 | Temp 97.3°F | Ht 71.0 in | Wt 194.4 lb

## 2024-04-27 DIAGNOSIS — E785 Hyperlipidemia, unspecified: Secondary | ICD-10-CM

## 2024-04-27 DIAGNOSIS — Z0000 Encounter for general adult medical examination without abnormal findings: Secondary | ICD-10-CM

## 2024-04-27 DIAGNOSIS — Z131 Encounter for screening for diabetes mellitus: Secondary | ICD-10-CM

## 2024-04-27 DIAGNOSIS — M1000 Idiopathic gout, unspecified site: Secondary | ICD-10-CM | POA: Diagnosis not present

## 2024-04-27 DIAGNOSIS — E663 Overweight: Secondary | ICD-10-CM | POA: Diagnosis not present

## 2024-04-27 LAB — COMPREHENSIVE METABOLIC PANEL WITH GFR
ALT: 30 U/L (ref 0–53)
AST: 27 U/L (ref 0–37)
Albumin: 4.1 g/dL (ref 3.5–5.2)
Alkaline Phosphatase: 79 U/L (ref 39–117)
BUN: 14 mg/dL (ref 6–23)
CO2: 31 meq/L (ref 19–32)
Calcium: 9.2 mg/dL (ref 8.4–10.5)
Chloride: 105 meq/L (ref 96–112)
Creatinine, Ser: 0.98 mg/dL (ref 0.40–1.50)
GFR: 89.84 mL/min (ref >60.00)
Glucose, Bld: 102 mg/dL — ABNORMAL HIGH (ref 70–99)
Potassium: 4.4 meq/L (ref 3.5–5.1)
Sodium: 142 meq/L (ref 135–145)
Total Bilirubin: 0.3 mg/dL (ref 0.2–1.2)
Total Protein: 6.8 g/dL (ref 6.0–8.3)

## 2024-04-27 LAB — HEMOGLOBIN A1C: Hgb A1c MFr Bld: 5.5 % (ref 4.6–6.5)

## 2024-04-27 LAB — CBC WITH DIFFERENTIAL/PLATELET
Basophils Absolute: 0 K/uL (ref 0.0–0.1)
Basophils Relative: 0.8 % (ref 0.0–3.0)
Eosinophils Absolute: 0.2 K/uL (ref 0.0–0.7)
Eosinophils Relative: 4.1 % (ref 0.0–5.0)
HCT: 43.4 % (ref 39.0–52.0)
Hemoglobin: 14.7 g/dL (ref 13.0–17.0)
Lymphocytes Relative: 21.6 % (ref 12.0–46.0)
Lymphs Abs: 1.1 K/uL (ref 0.7–4.0)
MCHC: 33.9 g/dL (ref 30.0–36.0)
MCV: 88.6 fl (ref 78.0–100.0)
Monocytes Absolute: 0.5 K/uL (ref 0.1–1.0)
Monocytes Relative: 9.7 % (ref 3.0–12.0)
Neutro Abs: 3.4 K/uL (ref 1.4–7.7)
Neutrophils Relative %: 63.8 % (ref 43.0–77.0)
Platelets: 243 K/uL (ref 150.0–400.0)
RBC: 4.9 Mil/uL (ref 4.22–5.81)
RDW: 13.4 % (ref 11.5–15.5)
WBC: 5.3 K/uL (ref 4.0–10.5)

## 2024-04-27 LAB — LIPID PANEL
Cholesterol: 227 mg/dL — ABNORMAL HIGH (ref 0–200)
HDL: 68.4 mg/dL (ref >39.00)
LDL Cholesterol: 140 mg/dL — ABNORMAL HIGH (ref 0–99)
NonHDL: 158.91
Total CHOL/HDL Ratio: 3
Triglycerides: 97 mg/dL (ref 0.0–149.0)
VLDL: 19.4 mg/dL (ref 0.0–40.0)

## 2024-04-27 LAB — URIC ACID: Uric Acid, Serum: 6.5 mg/dL (ref 4.0–7.8)

## 2024-04-27 MED ORDER — COLCHICINE 0.6 MG PO TABS
ORAL_TABLET | ORAL | 2 refills | Status: AC
  Filled 2024-06-17: qty 10, 7d supply, fill #0

## 2024-04-27 NOTE — Patient Instructions (Addendum)
 Please stop by lab before you go If you have mychart- we will send your results within 3 business days of us  receiving them.  If you do not have mychart- we will call you about results within 5 business days of us  receiving them.  *please also note that you will see labs on mychart as soon as they post. I will later go in and write notes on them- will say notes from Dr. Katrinka   If we start allopurinol then we may do labs at about 2 months to recheck uric acid and liver  Recommended follow up: Return in about 1 year (around 04/27/2025) for physical or sooner if needed.Schedule b4 you leave.

## 2024-04-27 NOTE — Progress Notes (Signed)
 Phone: [phone]   Subjective:  Patient presents today for their annual physical. Chief complaint-noted.   See problem oriented charting- ROS- full  review of systems was completed and negative  Per full ROS sheet completed by patient except for topics noted under acute/chronic concerns  The following were reviewed and entered/updated in epic: Past Medical History:  Diagnosis Date   Pulmonary embolism (HCC) 2008   After broken ankle/DVT that went to lung. Coumadin/lovenox 3-4 months.    Patient Active Problem List   Diagnosis Date Noted   History of pulmonary embolism     Priority: High   Hyperlipidemia 12/12/2021    Priority: Medium    Erectile dysfunction 05/10/2015    Priority: Low   Acute medial meniscal tear, right, initial encounter 03/21/2022   Dupuytren's contracture of right hand 03/09/2019   Past Surgical History:  Procedure Laterality Date   ANKLE FRACTURE SURGERY     Dr. Yvone   LUMBAR SPINE SURGERY      Family History  Problem Relation Age of Onset   Other Mother        sinus cavity cancer- presented with eye issues. 6 months chemo/radiation- lost eye   Alcohol abuse Father        estranged   Colon cancer Father        was told this by dad's GF- died 63-70.    Breast cancer Other        gma- 89,lived to 101   Breast cancer Paternal Grandmother     Medications- reviewed and updated Current Outpatient Medications  Medication Sig Dispense Refill   clindamycin  (CLEOCIN -T) 1 % lotion Apply topically daily. Apply to active red spots or flairs 60 mL 2   ibuprofen (ADVIL) 200 MG tablet Take 200 mg by mouth every 6 (six) hours as needed.     Multiple Vitamin (MULTI-DAY VITAMINS PO) Take by mouth.     colchicine  0.6 MG tablet Take 1.2mg  (2 tablets) then 0.6mg  (1 tablet) 1 hour after. Then, take 1 tablet every day for 7 days. 10 tablet 2   doxycycline  (VIBRAMYCIN ) 50 MG capsule Take 1 capsule (50 mg total) by mouth daily. 90 capsule 2   No current  facility-administered medications for this visit.    Allergies-reviewed and updated No Known Allergies  Social History   Social History Narrative   Married. Son Ty 86 years old 11/2020. Wife OR note float pool.        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 [employer]: active enjoys outside, used to race motorcycles, travel, enjoys cooking/foodie.    Objective  Objective:  BP 100/64   Pulse 61   Temp (!) 97.3 F (36.3 C)   Ht 5' 11 (1.803 m)   Wt 194 lb 6.4 oz (88.2 kg)   SpO2 96%   BMI 27.11 kg/m  Gen: NAD, resting comfortably HEENT: Mucous membranes are moist. Oropharynx normal Neck: no thyromegaly CV: RRR no murmurs rubs or gallops Lungs: CTAB no crackles, wheeze, rhonchi Abdomen: soft/nontender/nondistended/normal bowel sounds. No rebound or guarding.  Ext: no edema Skin: warm, dry Neuro: grossly normal, moves all extremities, PERRLA Declines genitourinary or rectal exam    Assessment and Plan  53 y.o. male presenting for annual physical.  Health Maintenance counseling: 1. Anticipatory guidance: Patient counseled regarding regular dental exams -q6 months, eye exams -yearly,  avoiding smoking and second hand smoke , limiting alcohol to 2 beverages per day - 6 a week, no illicit  drugs .   2. Risk factor reduction:  Advised patient of need for regular exercise and diet rich and fruits and vegetables to reduce risk of heart attack and stroke.  Exercise- active with work and active with son- no recent tennis- may restart. Could consider gym as well  Diet/weight management-weight stable over last year- mild weight loss would be beneficial.  Wt Readings from Last 3 Encounters:  04/27/24 194 lb 6.4 oz (88.2 kg)  08/15/23 196 lb (88.9 kg)  05/24/23 194 lb 2 oz (88.1 kg)  3. Immunizations/screenings/ancillary studies- had Hepatitis B Virus (HBV) vaccine in childhood. Declines COVID. Shingrix declines for now. Generally holds off on flu. Discussed due for  Tetanus, Diphtheria, and Pertussis (Tdap) next year Immunization History  Administered Date(s) Administered   PFIZER(Purple Top)SARS-COV-2 Vaccination 05/03/2020, 06/03/2020   Tdap 05/09/2015  4. Prostate cancer screening-  no family history, start at age 35   5. Colon cancer screening -  09/28/20 with 5-year repeat due to family history  6. Skin cancer screening- sees Dr. Corey. advised regular sunscreen use. Denies worrisome, changing, or new skin lesions.  7. Smoking associated screening (lung cancer screening, AAA screen 65-75, UA)-never smoker 8. 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 Also had steroid injection with Dr. Silva -had another flare at beach recently with looser diet Lab Results  Component Value Date   LABURIC 8.2 12/31/2023  A/P:wants to recheck uric acid before deciding on allouprinol - check today- suspect may start with 100 mg dose and recheck in 2 months along with CMP    #hyperlipidemia S: Medication: none  The 10-year ASCVD risk score (Arnett DK, et al., 2019) is: 2.9% Lab Results  Component Value Date   CHOL 278 (H) 12/13/2021   HDL 61.50 12/13/2021   LDLDIRECT 152.0 12/13/2021   TRIG 333.0 (H) 12/13/2021   CHOLHDL 5 12/13/2021   A/P: lipids moderately elevated- working on healthy eating and regular exercise - recheck arteriosclerotic cardiovascular disease risk and lipids  Recommended follow up: Return in about 1 year (around 04/27/2025) for physical or sooner if needed.Schedule b4 you leave.  Lab/Order associations: fasting   ICD-10-CM   1. Preventative health care  Z00.00     2. Hyperlipidemia, unspecified hyperlipidemia type  E78.5 Comprehensive metabolic panel with GFR    CBC with Differential/Platelet    Lipid panel    3.  Screening for diabetes mellitus  Z13.1 Hemoglobin A1c    4. Overweight  E66.3 Hemoglobin A1c    5. Idiopathic gout, unspecified chronicity, unspecified site  M10.00 Uric acid      Meds ordered this encounter  Medications   colchicine  0.6 MG tablet    Sig: Take 1.2mg  (2 tablets) then 0.6mg  (1 tablet) 1 hour after. Then, take 1 tablet every day for 7 days.    Dispense:  10 tablet    Refill:  2    Return precautions advised.  Garnette Lukes, MD

## 2024-05-04 ENCOUNTER — Telehealth: Payer: Self-pay | Admitting: Family Medicine

## 2024-05-04 ENCOUNTER — Other Ambulatory Visit: Payer: Self-pay | Admitting: Family Medicine

## 2024-05-04 DIAGNOSIS — M1000 Idiopathic gout, unspecified site: Secondary | ICD-10-CM

## 2024-05-04 MED ORDER — ALLOPURINOL 100 MG PO TABS
100.0000 mg | ORAL_TABLET | Freq: Every day | ORAL | 3 refills | Status: AC
  Filled 2024-06-17: qty 90, 90d supply, fill #0
  Filled 2024-10-05: qty 90, 90d supply, fill #1

## 2024-05-04 NOTE — Telephone Encounter (Signed)
 Patient returned call regarding new script for allopurinol . Medicine sent per note from Dr. Katrinka. Pt verbalized understanding and no further questions at this time.

## 2024-05-05 ENCOUNTER — Ambulatory Visit: Admitting: Dermatology

## 2024-06-16 ENCOUNTER — Other Ambulatory Visit (HOSPITAL_COMMUNITY): Payer: Self-pay

## 2024-06-17 ENCOUNTER — Other Ambulatory Visit (HOSPITAL_COMMUNITY): Payer: Self-pay

## 2024-06-17 ENCOUNTER — Other Ambulatory Visit: Payer: Self-pay

## 2024-06-18 ENCOUNTER — Other Ambulatory Visit (HOSPITAL_COMMUNITY): Payer: Self-pay

## 2024-06-23 ENCOUNTER — Other Ambulatory Visit (HOSPITAL_COMMUNITY): Payer: Self-pay

## 2024-06-24 ENCOUNTER — Other Ambulatory Visit: Payer: Self-pay

## 2024-06-24 ENCOUNTER — Other Ambulatory Visit (HOSPITAL_COMMUNITY): Payer: Self-pay

## 2024-10-06 ENCOUNTER — Other Ambulatory Visit (HOSPITAL_COMMUNITY): Payer: Self-pay

## 2025-04-28 ENCOUNTER — Encounter: Admitting: Family Medicine
